# Patient Record
Sex: Male | Born: 1965 | Race: White | Hispanic: No | Smoking: Current every day smoker
Health system: Southern US, Community
[De-identification: ages and names within clinical notes are randomized; demographics above are authoritative.]

## PROBLEM LIST (undated history)

## (undated) DIAGNOSIS — I2510 Atherosclerotic heart disease of native coronary artery without angina pectoris: Secondary | ICD-10-CM

## (undated) DIAGNOSIS — R569 Unspecified convulsions: Secondary | ICD-10-CM

## (undated) DIAGNOSIS — F32A Depression, unspecified: Secondary | ICD-10-CM

## (undated) DIAGNOSIS — D649 Anemia, unspecified: Secondary | ICD-10-CM

## (undated) DIAGNOSIS — F329 Major depressive disorder, single episode, unspecified: Secondary | ICD-10-CM

## (undated) DIAGNOSIS — Z993 Dependence on wheelchair: Secondary | ICD-10-CM

## (undated) DIAGNOSIS — K219 Gastro-esophageal reflux disease without esophagitis: Secondary | ICD-10-CM

## (undated) DIAGNOSIS — I739 Peripheral vascular disease, unspecified: Secondary | ICD-10-CM

## (undated) DIAGNOSIS — I10 Essential (primary) hypertension: Secondary | ICD-10-CM

## (undated) DIAGNOSIS — J449 Chronic obstructive pulmonary disease, unspecified: Secondary | ICD-10-CM

## (undated) DIAGNOSIS — I509 Heart failure, unspecified: Secondary | ICD-10-CM

## (undated) HISTORY — PX: VASCULAR SURGERY: SHX849

## (undated) HISTORY — PX: OTHER SURGICAL HISTORY: SHX169

---

## 2005-06-14 ENCOUNTER — Emergency Department (HOSPITAL_COMMUNITY): Admission: EM | Admit: 2005-06-14 | Discharge: 2005-06-14 | Payer: Self-pay | Admitting: Emergency Medicine

## 2005-06-21 ENCOUNTER — Ambulatory Visit: Admission: RE | Admit: 2005-06-21 | Discharge: 2005-06-21 | Payer: Self-pay | Admitting: Podiatry

## 2005-06-21 ENCOUNTER — Encounter: Payer: Self-pay | Admitting: Vascular Surgery

## 2005-06-22 ENCOUNTER — Emergency Department (HOSPITAL_COMMUNITY): Admission: EM | Admit: 2005-06-22 | Discharge: 2005-06-22 | Payer: Self-pay | Admitting: Emergency Medicine

## 2005-06-24 ENCOUNTER — Ambulatory Visit (HOSPITAL_COMMUNITY): Admission: RE | Admit: 2005-06-24 | Discharge: 2005-06-24 | Payer: Self-pay | Admitting: Vascular Surgery

## 2005-06-29 ENCOUNTER — Inpatient Hospital Stay (HOSPITAL_COMMUNITY): Admission: RE | Admit: 2005-06-29 | Discharge: 2005-07-02 | Payer: Self-pay | Admitting: Vascular Surgery

## 2005-06-29 ENCOUNTER — Encounter (INDEPENDENT_AMBULATORY_CARE_PROVIDER_SITE_OTHER): Payer: Self-pay | Admitting: Specialist

## 2005-06-30 ENCOUNTER — Encounter: Payer: Self-pay | Admitting: Vascular Surgery

## 2005-07-29 ENCOUNTER — Ambulatory Visit: Payer: Self-pay | Admitting: Pain Medicine

## 2005-08-02 ENCOUNTER — Ambulatory Visit: Payer: Self-pay | Admitting: Pain Medicine

## 2005-08-09 ENCOUNTER — Ambulatory Visit: Payer: Self-pay | Admitting: Pain Medicine

## 2005-08-10 ENCOUNTER — Inpatient Hospital Stay (HOSPITAL_COMMUNITY): Admission: EM | Admit: 2005-08-10 | Discharge: 2005-08-16 | Payer: Self-pay | Admitting: Emergency Medicine

## 2005-08-13 ENCOUNTER — Ambulatory Visit: Payer: Self-pay | Admitting: Physical Medicine & Rehabilitation

## 2005-08-13 ENCOUNTER — Encounter: Payer: Self-pay | Admitting: Vascular Surgery

## 2006-04-06 ENCOUNTER — Ambulatory Visit: Payer: Self-pay | Admitting: Vascular Surgery

## 2006-04-07 ENCOUNTER — Emergency Department (HOSPITAL_COMMUNITY): Admission: EM | Admit: 2006-04-07 | Discharge: 2006-04-07 | Payer: Self-pay | Admitting: Emergency Medicine

## 2006-04-11 ENCOUNTER — Ambulatory Visit: Payer: Self-pay | Admitting: Vascular Surgery

## 2006-04-11 ENCOUNTER — Ambulatory Visit (HOSPITAL_COMMUNITY): Admission: RE | Admit: 2006-04-11 | Discharge: 2006-04-11 | Payer: Self-pay | Admitting: Vascular Surgery

## 2006-04-14 ENCOUNTER — Inpatient Hospital Stay (HOSPITAL_COMMUNITY): Admission: RE | Admit: 2006-04-14 | Discharge: 2006-04-16 | Payer: Self-pay | Admitting: Vascular Surgery

## 2006-05-03 ENCOUNTER — Emergency Department (HOSPITAL_COMMUNITY): Admission: EM | Admit: 2006-05-03 | Discharge: 2006-05-04 | Payer: Self-pay | Admitting: Emergency Medicine

## 2006-05-08 ENCOUNTER — Emergency Department: Payer: Self-pay

## 2006-05-08 ENCOUNTER — Emergency Department (HOSPITAL_COMMUNITY): Admission: EM | Admit: 2006-05-08 | Discharge: 2006-05-08 | Payer: Self-pay | Admitting: Emergency Medicine

## 2006-05-11 ENCOUNTER — Ambulatory Visit: Payer: Self-pay | Admitting: Vascular Surgery

## 2006-05-17 ENCOUNTER — Inpatient Hospital Stay (HOSPITAL_COMMUNITY): Admission: RE | Admit: 2006-05-17 | Discharge: 2006-05-21 | Payer: Self-pay | Admitting: Vascular Surgery

## 2006-05-17 ENCOUNTER — Encounter (INDEPENDENT_AMBULATORY_CARE_PROVIDER_SITE_OTHER): Payer: Self-pay | Admitting: Specialist

## 2006-05-17 ENCOUNTER — Ambulatory Visit: Payer: Self-pay | Admitting: Vascular Surgery

## 2006-05-18 ENCOUNTER — Ambulatory Visit: Payer: Self-pay | Admitting: Physical Medicine & Rehabilitation

## 2006-06-22 ENCOUNTER — Ambulatory Visit: Payer: Self-pay | Admitting: Vascular Surgery

## 2006-07-01 ENCOUNTER — Ambulatory Visit: Payer: Self-pay | Admitting: Vascular Surgery

## 2006-11-20 ENCOUNTER — Emergency Department (HOSPITAL_COMMUNITY): Admission: EM | Admit: 2006-11-20 | Discharge: 2006-11-20 | Payer: Self-pay | Admitting: Emergency Medicine

## 2007-01-16 ENCOUNTER — Ambulatory Visit: Payer: Self-pay | Admitting: Oncology

## 2007-03-16 ENCOUNTER — Ambulatory Visit: Payer: Self-pay | Admitting: Oncology

## 2007-11-05 ENCOUNTER — Emergency Department: Payer: Self-pay | Admitting: Emergency Medicine

## 2007-12-28 ENCOUNTER — Emergency Department: Payer: Self-pay | Admitting: Emergency Medicine

## 2008-02-16 ENCOUNTER — Emergency Department: Payer: Self-pay | Admitting: Emergency Medicine

## 2008-03-10 ENCOUNTER — Emergency Department: Payer: Self-pay | Admitting: Emergency Medicine

## 2008-03-12 ENCOUNTER — Emergency Department: Payer: Self-pay | Admitting: Emergency Medicine

## 2008-04-08 ENCOUNTER — Emergency Department: Payer: Self-pay | Admitting: Emergency Medicine

## 2009-05-29 ENCOUNTER — Ambulatory Visit: Payer: Self-pay | Admitting: Vascular Surgery

## 2009-05-31 ENCOUNTER — Emergency Department: Payer: Self-pay

## 2009-06-13 ENCOUNTER — Emergency Department: Payer: Self-pay

## 2009-06-16 ENCOUNTER — Emergency Department (HOSPITAL_COMMUNITY): Admission: EM | Admit: 2009-06-16 | Discharge: 2009-06-16 | Payer: Self-pay | Admitting: Emergency Medicine

## 2009-06-19 ENCOUNTER — Emergency Department: Payer: Self-pay | Admitting: Emergency Medicine

## 2010-06-23 NOTE — Assessment & Plan Note (Signed)
OFFICE VISIT   [name], [name]  DOB:  Jul 31, 1965                                       05/29/2009  CHART#:[id number]   I saw patient in the office today concerning his bilateral below-the-  knee amputations.  He had a right below-the-knee amputation in 2007 and  subsequently had a left below-the-knee amputation in April of 2008.  He  came in today with a chief complaint of some phantom pain in the right  lower extremity and also a small wound on his right below-the-knee  amputation stump.  He does not remember any specific injury to this  area.  He has had no real pain in his stumps otherwise.   SOCIAL HISTORY:  He does continue to smoke.   On review of systems, he has had no recent fever or chills.  He had no  chest pain, chest pressure, palpitations or arrhythmias.   On physical examination, this is a pleasant 45 year old gentleman who  appears his stated age.  Blood pressure is 122/75. Heart rate is 71.  Lungs are clear bilaterally to auscultation.  On cardiovascular exam, he  has a regular rate and rhythm.  He has palpable femoral pulses.  I  cannot palpate popliteal pulses.  Extremity exam reveals a small, dry,  superficial ulcer on his distal right below-the-knee amputation site  with no surrounding cellulitis or drainage.  Of note, he is quite  malnourished and has muscle wasting bilaterally.  He states that he has  had a poor appetite.  Both stumps appear adequately perfused and have a  good temperature.   With respect to his phantom pain, we did review his medications and  noted the CBC in [facility], and apparently he has been on methadone,  although he tells me he has not been taking it.  In addition, they have  him on Lyrica 50 mg t.i.d., although he tells me he did not have this  filled because it was too expensive.  I have explained that with respect  to his phantom pain, I think the Lyrica would be best.  He is trying to  get his  Medicaid card so that he can get this prescription filled.  They  have had him on 50 t.i.d.  If this were not successful, he could  potentially take a higher dose up to 100 mg t.i.d.  For the time being,  I have written him a prescription for 30 Ultram for pain.  We have  discussed the importance of keeping the skin well lubricated in hopes of  this ulcer at the distal of the right BKA heals.  I have also expressed  my concerns about his nutritional status.  I have instructed him to  please call if the wound does not heal on the right or his symptoms  progress.     Di Kindle. Edilia Bo, M.D.  Electronically Signed   CSD/MEDQ  D:  05/29/2009  T:  06/02/2009  Job:  [id number]

## 2010-06-26 NOTE — Discharge Summary (Signed)
[name], [name]             ACCOUNT NO.:  [id number]   MEDICAL RECORD NO.:  [id number]          PATIENT TYPE:  INP   LOCATION:  5028                         FACILITY:  [facility]   PHYSICIAN:  Di Kindle. Edilia Bo, M.D.DATE OF BIRTH:  1965/12/20   DATE OF ADMISSION:  05/17/2006  DATE OF DISCHARGE:                               DISCHARGE SUMMARY   DATE OF ANTICIPATED DISCHARGE:  May 21, 2006.   PRIMARY ADMITTING DIAGNOSES:  1. Rest pain, left foot.  2. Ischemic left lower extremity.   ADDITIONAL DISCHARGE DIAGNOSES:  1. Rest pain, left foot.  2. Ischemic left lower extremity.  3. History of seizure disorder.  4. History of pain control.  5. History of substance abuse on methadone.  6. Ongoing tobacco abuse.   PROCEDURES PERFORMED:  Left below-knee amputation.   HISTORY:  The patient is a 45 year old male who is known to the vascular  surgery service for a previous right below-knee amputation.  He recently  presented to Dr. Adele Dan office with progressive rest pain and dry  gangrene of the left great toe.  He underwent an arteriogram which  showed that his only patent distal vessel was a tarsal branch in the  left foot.  As a last ditch effort to try to salvage his left foot, he  underwent a left below-knee popliteal to tarsal artery bypass.  Since  that time, he has continued to have persistent rest pain in the left  foot and recently was seen back in the office by Dr. Edilia Bo.  It was  felt that this time that because of his pain control issues and lack of  further possibilities for revascularization, that he should undergo a  below-knee amputation at this time.  Dr. Edilia Bo explained the risks,  benefits, and alternatives of the procedure to the patient and he agreed  to proceed with surgery.   HOSPITAL COURSE:  He was admitted to [facility] on May 17, 2006, and underwent a left below knee amputation.  He tolerated the  procedure well and was  transferred to the floor in stable condition.  Postoperatively, he has done fairly well.  He initially required PCA  Dilaudid, in addition to ongoing methadone for pain control.  However,  at present the PCA has been discontinued and he is having fair pain  control with oral pain medications.  From a surgical standpoint, he has  remained stable.  His incisions are all healing well.  He has been seen  by physical therapy and has met all of his acute PT goals.  He has  remained afebrile and all vital signs have been stable.  His most recent  labs show a hemoglobin of 9.3, hematocrit 26.7, white count 11.6,  platelets 472.  Sodium 137, potassium 4, BUN 7, creatinine 0.74.  He has  been switched from IV to p.o. antibiotics and it is felt that if he  remained stable over the next 24 hours, he will be ready for discharged  home on May 21, 2006.   DISCHARGE MEDICATIONS:  1. Cipro 500 mg b.i.d. with duration of antibiotics to be  determined      by Dr. Edilia Bo upon followup in the office.  2. Methadone 40 mg q.6 h.  3. Dilantin 100 mg, 3 tablets daily.   DISCHARGE INSTRUCTIONS:  1. He is asked to refrain from any strenuous activity.  2. Home health PT will be arranged.  3. All durable medical equipment has been obtained by the clinical      social worker.  4. He will continue his same preoperative diet.  5. He may clean his incision site daily with soap and water and      continue daily dressing changes.   DISCHARGE FOLLOWUP:  He will see Dr. Edilia Bo back in the office in 3-4  weeks, at which time staples will be removed and his wound will be  rechecked.  If he experiences any problems or has questions in the  interim, he is asked to contact our office immediately.      Coral Ceo, P.A.      Di Kindle. Edilia Bo, M.D.  Electronically Signed    GC/MEDQ  D:  05/20/2006  T:  05/20/2006  Job:  [id number]

## 2010-06-26 NOTE — Op Note (Signed)
[name], [name]             ACCOUNT NO.:  [id number]   MEDICAL RECORD NO.:  [id number]          PATIENT TYPE:  INP   LOCATION:  2550                         FACILITY:  [facility]   PHYSICIAN:  Di Kindle. Edilia Bo, M.D.DATE OF BIRTH:  1965/03/27   DATE OF PROCEDURE:  05/17/2006  DATE OF DISCHARGE:                               OPERATIVE REPORT   PREOPERATIVE DIAGNOSIS:  Ischemic left lower extremity.   POSTOPERATIVE DIAGNOSIS:  Ischemic left lower extremity.   PROCEDURE:  Left below-the-knee amputation.   SURGEON:  Di Kindle. Edilia Bo, M.D.   ASSISTANT:  Jerold Coombe, P.A.   ANESTHESIA:  General.   TECHNIQUE:  The patient was taken to the operating room and received a  general anesthetic.  The left lower extremity was prepped and draped in  usual sterile fashion.  The circumference of limb was measured 8 cm  distal to the tibial tuberosity, and two-thirds of this distance was  used to mark the anterior skin flap.  A long posterior flap of equal  length was marked.  The tourniquet was placed on the thigh.  The leg was  exsanguinated with an Esmarch bandage and the tourniquet inflated to 250  mmHg.   Under tourniquet control, the incision was carried down to the skin,  subcutaneous tissue, muscle, and fascia of the tibia and fibula which  were dissected free.  The periosteum was elevated and the bone divided  proximal to the level of skin division.  The anterior aspect of the  tibia was beveled.  The arteries and veins were individually suture  ligated with 2-0 silk ties.  The tourniquet was then released.  I did  have to debulk the muscle to allow me to close the wound without undue  tension.  Hemostasis was obtained in the wound.  The edges of the bone  were rasped.  The wound was then irrigated with copious amounts of  saline.  The fascial layer was then closed with interrupted 2-0 Vicryl  sutures.  The skin was closed with staples.  A sterile dressing was  applied.   The patient tolerated the procedure well and was transferred to the  recovery room in satisfactory condition.  All needle and sponge counts  were correct.      Di Kindle. Edilia Bo, M.D.  Electronically Signed     CSD/MEDQ  D:  05/17/2006  T:  05/17/2006  Job:  [id number]

## 2010-06-26 NOTE — H&P (Signed)
[name], [name]              ACCOUNT NO.:  [id number]   MEDICAL RECORD NO.:  [id number]           PATIENT TYPE:   LOCATION:                                 FACILITY:   PHYSICIAN:  Di Kindle. Edilia Bo, M.D.DATE OF BIRTH:   DATE OF ADMISSION:  05/17/2006  DATE OF DISCHARGE:                              HISTORY & PHYSICAL   REASON FOR ADMISSION:  Rest pain of the left foot.   HISTORY OF PRESENT ILLNESS:  This is a pleasant 45 year old gentleman  who has undergone a previous right below-the-knee amputation.  He had  presented with progressive rest pain and dry gangrene of the left great  toe.  He underwent an arteriogram which showed the only distal vessel  was a tarsal branch in the foot.  As a last ditch effort to try to  salvage his left foot, he underwent a left below-knee popliteal to  tarsal artery bypass.  He has continued have persistent rest pain in the  left foot despite this and for this reason presents for a below-the-knee  amputation on the left side.   PAST MEDICAL HISTORY:  His past medical history is fairly unremarkable.  He does have a history of seizure disorder and is on Dilantin.  Also had  significant problems with pain control and history of drug addiction in  the past.  He denies any history of diabetes, hypertension,  hypercholesterolemia, history of previous myocardial infarction, or  history of congestive heart failure.   FAMILY HISTORY:  He is adopted.  He does not know his family history.   SOCIAL HISTORY:  Is married.  Does not have any children.  He smokes one  to two packs a day of cigarettes and has been smoking for 15 years.   MEDICATIONS ON ADMISSION:  1. Dilantin 300 mg p.o. daily.  2. Methadone 40 mg p.o. q.6 h for pain.   ALLERGIES:  No known drug allergies.   REVIEW OF SYSTEMS:  GENERAL:  Generally he has had no weight loss,  weight gain or problems with appetite.  CARDIAC:  He has had no chest  pain, chest pressure, palpitations,  or arrhythmias.  PULMONARY:  He has  had no bronchitis, asthma or wheezing.  GI:  He had no recent change in  bowel habits and has no history of peptic ulcer disease.  GU: He has had  no dysuria or frequency.  VASCULAR:  He had a right below-knee  amputation and now has rest pain in the left foot.  NEUROLOGIC:  He had  no dizziness, blackouts or headaches.   PHYSICAL EXAMINATION:  VITAL SIGNS: Blood pressure is 118/64.  NECK:  I do not detect any carotid bruits.  LUNGS:  Clear bilaterally to auscultation.  CARDIAC:  He has a regular rate and rhythm.  ABDOMEN:  His abdomen is soft and nontender.  EXTREMITIES:  He has a well-healed, right below-the-knee amputation.  On  the left side, he has a posterior tibial signal with a Doppler which may  represent his graft.  He has significant swelling in the left leg with  some mild erythema.  He is unable to elevate his  leg at all because of  the rest pain in his left foot.  The toe has shown no improvement.   IMPRESSION:  This patient presents with progressive ischemia of the left  foot despite attempted bypass in the left.  I think the only option is  primary amputation.  Given his young age, I think it is worth attempting  a below-the-knee amputation, although with all the problems he is having  with swelling, there is going to be some risk of nonhealing.  That risk  is probably 10-15%.  However, he is agreeable to proceed.  All his  questions were answered.  We have discussed the indications for the  procedure and potential complications.      Di Kindle. Edilia Bo, M.D.  Electronically Signed     CSD/MEDQ  D:  05/11/2006  T:  05/11/2006  Job:  [id number]

## 2010-11-29 ENCOUNTER — Emergency Department (HOSPITAL_COMMUNITY)
Admission: EM | Admit: 2010-11-29 | Discharge: 2010-11-30 | Disposition: A | Discharge status: Home / Self Care | Payer: Medicare Other | Attending: Emergency Medicine | Admitting: Emergency Medicine

## 2010-11-29 DIAGNOSIS — R252 Cramp and spasm: Secondary | ICD-10-CM | POA: Insufficient documentation

## 2010-11-29 DIAGNOSIS — M2550 Pain in unspecified joint: Secondary | ICD-10-CM | POA: Insufficient documentation

## 2010-11-29 DIAGNOSIS — S88119A Complete traumatic amputation at level between knee and ankle, unspecified lower leg, initial encounter: Secondary | ICD-10-CM | POA: Insufficient documentation

## 2010-11-29 DIAGNOSIS — M79609 Pain in unspecified limb: Secondary | ICD-10-CM | POA: Insufficient documentation

## 2010-11-29 DIAGNOSIS — Z79899 Other long term (current) drug therapy: Secondary | ICD-10-CM | POA: Insufficient documentation

## 2010-11-29 DIAGNOSIS — IMO0001 Reserved for inherently not codable concepts without codable children: Secondary | ICD-10-CM | POA: Insufficient documentation

## 2010-11-29 DIAGNOSIS — G40909 Epilepsy, unspecified, not intractable, without status epilepticus: Secondary | ICD-10-CM | POA: Insufficient documentation

## 2010-11-29 DIAGNOSIS — F17200 Nicotine dependence, unspecified, uncomplicated: Secondary | ICD-10-CM | POA: Insufficient documentation

## 2010-12-12 ENCOUNTER — Emergency Department (HOSPITAL_COMMUNITY)
Admission: EM | Admit: 2010-12-12 | Discharge: 2010-12-12 | Disposition: A | Discharge status: Home / Self Care | Payer: Medicare Other | Attending: Emergency Medicine | Admitting: Emergency Medicine

## 2010-12-12 DIAGNOSIS — G40909 Epilepsy, unspecified, not intractable, without status epilepticus: Secondary | ICD-10-CM | POA: Insufficient documentation

## 2010-12-12 DIAGNOSIS — M79609 Pain in unspecified limb: Secondary | ICD-10-CM | POA: Insufficient documentation

## 2010-12-12 DIAGNOSIS — G8929 Other chronic pain: Secondary | ICD-10-CM | POA: Insufficient documentation

## 2010-12-12 DIAGNOSIS — I739 Peripheral vascular disease, unspecified: Secondary | ICD-10-CM | POA: Insufficient documentation

## 2011-02-07 ENCOUNTER — Emergency Department: Payer: Self-pay | Admitting: Emergency Medicine

## 2012-12-28 ENCOUNTER — Encounter (HOSPITAL_COMMUNITY): Payer: Self-pay | Admitting: *Deleted

## 2012-12-28 ENCOUNTER — Observation Stay (HOSPITAL_COMMUNITY)
Admission: AD | Admit: 2012-12-28 | Discharge: 2012-12-31 | Disposition: A | Discharge status: Skilled Nursing Facility | Payer: Medicare Other | Source: Other Acute Inpatient Hospital | Attending: Internal Medicine | Admitting: Internal Medicine

## 2012-12-28 DIAGNOSIS — G894 Chronic pain syndrome: Secondary | ICD-10-CM

## 2012-12-28 DIAGNOSIS — F17200 Nicotine dependence, unspecified, uncomplicated: Secondary | ICD-10-CM | POA: Insufficient documentation

## 2012-12-28 DIAGNOSIS — J449 Chronic obstructive pulmonary disease, unspecified: Secondary | ICD-10-CM | POA: Insufficient documentation

## 2012-12-28 DIAGNOSIS — I82409 Acute embolism and thrombosis of unspecified deep veins of unspecified lower extremity: Secondary | ICD-10-CM | POA: Insufficient documentation

## 2012-12-28 DIAGNOSIS — I701 Atherosclerosis of renal artery: Secondary | ICD-10-CM | POA: Insufficient documentation

## 2012-12-28 DIAGNOSIS — I2510 Atherosclerotic heart disease of native coronary artery without angina pectoris: Secondary | ICD-10-CM | POA: Insufficient documentation

## 2012-12-28 DIAGNOSIS — I2699 Other pulmonary embolism without acute cor pulmonale: Principal | ICD-10-CM | POA: Insufficient documentation

## 2012-12-28 DIAGNOSIS — N189 Chronic kidney disease, unspecified: Secondary | ICD-10-CM | POA: Insufficient documentation

## 2012-12-28 DIAGNOSIS — I999 Unspecified disorder of circulatory system: Secondary | ICD-10-CM

## 2012-12-28 DIAGNOSIS — S88119A Complete traumatic amputation at level between knee and ankle, unspecified lower leg, initial encounter: Secondary | ICD-10-CM | POA: Insufficient documentation

## 2012-12-28 DIAGNOSIS — I739 Peripheral vascular disease, unspecified: Secondary | ICD-10-CM

## 2012-12-28 DIAGNOSIS — I428 Other cardiomyopathies: Secondary | ICD-10-CM | POA: Insufficient documentation

## 2012-12-28 DIAGNOSIS — F1120 Opioid dependence, uncomplicated: Secondary | ICD-10-CM

## 2012-12-28 DIAGNOSIS — Z89511 Acquired absence of right leg below knee: Secondary | ICD-10-CM

## 2012-12-28 DIAGNOSIS — I129 Hypertensive chronic kidney disease with stage 1 through stage 4 chronic kidney disease, or unspecified chronic kidney disease: Secondary | ICD-10-CM | POA: Insufficient documentation

## 2012-12-28 DIAGNOSIS — F3289 Other specified depressive episodes: Secondary | ICD-10-CM | POA: Insufficient documentation

## 2012-12-28 DIAGNOSIS — F329 Major depressive disorder, single episode, unspecified: Secondary | ICD-10-CM | POA: Insufficient documentation

## 2012-12-28 DIAGNOSIS — R569 Unspecified convulsions: Secondary | ICD-10-CM | POA: Insufficient documentation

## 2012-12-28 DIAGNOSIS — I509 Heart failure, unspecified: Secondary | ICD-10-CM

## 2012-12-28 DIAGNOSIS — J4489 Other specified chronic obstructive pulmonary disease: Secondary | ICD-10-CM

## 2012-12-28 HISTORY — DX: Atherosclerotic heart disease of native coronary artery without angina pectoris: I25.10

## 2012-12-28 HISTORY — DX: Unspecified convulsions: R56.9

## 2012-12-28 HISTORY — DX: Major depressive disorder, single episode, unspecified: F32.9

## 2012-12-28 HISTORY — DX: Heart failure, unspecified: I50.9

## 2012-12-28 HISTORY — DX: Chronic obstructive pulmonary disease, unspecified: J44.9

## 2012-12-28 HISTORY — DX: Depression, unspecified: F32.A

## 2012-12-28 HISTORY — DX: Anemia, unspecified: D64.9

## 2012-12-28 HISTORY — DX: Peripheral vascular disease, unspecified: I73.9

## 2012-12-28 HISTORY — DX: Essential (primary) hypertension: I10

## 2012-12-28 MED ORDER — LISINOPRIL 2.5 MG PO TABS
2.5000 mg | ORAL_TABLET | Freq: Every day | ORAL | Status: DC
  Administered 2012-12-28 – 2012-12-29 (×2): 2.5 mg via ORAL
  Filled 2012-12-28 (×2): qty 1

## 2012-12-28 MED ORDER — DIAZEPAM 5 MG PO TABS
5.0000 mg | ORAL_TABLET | Freq: Three times a day (TID) | ORAL | Status: DC | PRN
  Administered 2012-12-28 – 2012-12-31 (×8): 5 mg via ORAL
  Filled 2012-12-28 (×8): qty 1

## 2012-12-28 MED ORDER — FERROUS SULFATE 325 (65 FE) MG PO TABS
325.0000 mg | ORAL_TABLET | Freq: Two times a day (BID) | ORAL | Status: DC
  Administered 2012-12-28 – 2012-12-30 (×5): 325 mg via ORAL
  Filled 2012-12-28 (×10): qty 1

## 2012-12-28 MED ORDER — FENTANYL 50 MCG/HR TD PT72
100.0000 ug | MEDICATED_PATCH | TRANSDERMAL | Status: DC
  Administered 2012-12-28: 100 ug via TRANSDERMAL
  Filled 2012-12-28: qty 2

## 2012-12-28 MED ORDER — CARVEDILOL 25 MG PO TABS
25.0000 mg | ORAL_TABLET | Freq: Two times a day (BID) | ORAL | Status: DC
  Administered 2012-12-28 – 2012-12-31 (×6): 25 mg via ORAL
  Filled 2012-12-28 (×12): qty 1

## 2012-12-28 MED ORDER — OXYCODONE HCL 5 MG PO TABS
5.0000 mg | ORAL_TABLET | ORAL | Status: DC | PRN
  Administered 2012-12-28 – 2012-12-29 (×3): 5 mg via ORAL
  Filled 2012-12-28 (×3): qty 1

## 2012-12-28 MED ORDER — VITAMIN D3 25 MCG (1000 UNIT) PO TABS
2000.0000 [IU] | ORAL_TABLET | Freq: Every day | ORAL | Status: DC
  Administered 2012-12-29 – 2012-12-30 (×2): 2000 [IU] via ORAL
  Filled 2012-12-28 (×3): qty 2

## 2012-12-28 MED ORDER — ONDANSETRON HCL 4 MG PO TABS
4.0000 mg | ORAL_TABLET | Freq: Four times a day (QID) | ORAL | Status: DC | PRN
  Administered 2012-12-31: 4 mg via ORAL
  Filled 2012-12-28: qty 1

## 2012-12-28 MED ORDER — MEGESTROL ACETATE 400 MG/10ML PO SUSP
400.0000 mg | Freq: Every day | ORAL | Status: DC
  Administered 2012-12-28 – 2012-12-30 (×3): 400 mg via ORAL
  Filled 2012-12-28 (×4): qty 10

## 2012-12-28 MED ORDER — PANTOPRAZOLE SODIUM 40 MG PO TBEC
40.0000 mg | DELAYED_RELEASE_TABLET | Freq: Every day | ORAL | Status: DC
  Administered 2012-12-28 – 2012-12-30 (×3): 40 mg via ORAL
  Filled 2012-12-28 (×3): qty 1

## 2012-12-28 MED ORDER — MORPHINE SULFATE 2 MG/ML IJ SOLN
2.0000 mg | Freq: Once | INTRAMUSCULAR | Status: AC
  Administered 2012-12-28: 2 mg via INTRAVENOUS
  Filled 2012-12-28: qty 1

## 2012-12-28 MED ORDER — VITAMIN D-3 25 MCG (1000 UT) PO CAPS: 2000.0000 [IU] | ORAL_CAPSULE | Freq: Every day | ORAL | Status: DC

## 2012-12-28 MED ORDER — SENNA 8.6 MG PO TABS
1.0000 | ORAL_TABLET | Freq: Two times a day (BID) | ORAL | Status: DC
  Administered 2012-12-28 – 2012-12-30 (×3): 8.6 mg via ORAL
  Filled 2012-12-28 (×10): qty 1

## 2012-12-28 MED ORDER — ONDANSETRON HCL 4 MG/2ML IJ SOLN
4.0000 mg | Freq: Four times a day (QID) | INTRAMUSCULAR | Status: DC | PRN
  Filled 2012-12-28: qty 2

## 2012-12-28 MED ORDER — METHADONE HCL 10 MG PO TABS
20.0000 mg | ORAL_TABLET | Freq: Three times a day (TID) | ORAL | Status: DC
  Administered 2012-12-28 – 2012-12-29 (×3): 20 mg via ORAL
  Filled 2012-12-28 (×3): qty 2

## 2012-12-28 MED ORDER — ACETAMINOPHEN 325 MG PO TABS
650.0000 mg | ORAL_TABLET | Freq: Four times a day (QID) | ORAL | Status: DC | PRN
Start: 2012-12-28 — End: 2012-12-31
  Administered 2012-12-29 – 2012-12-30 (×3): 650 mg via ORAL
  Filled 2012-12-28 (×4): qty 2

## 2012-12-28 MED ORDER — ATORVASTATIN CALCIUM 20 MG PO TABS
20.0000 mg | ORAL_TABLET | Freq: Every day | ORAL | Status: DC
  Administered 2012-12-28 – 2012-12-30 (×3): 20 mg via ORAL
  Filled 2012-12-28 (×4): qty 1

## 2012-12-28 MED ORDER — DOCUSATE SODIUM 100 MG PO CAPS
100.0000 mg | ORAL_CAPSULE | Freq: Two times a day (BID) | ORAL | Status: DC
  Administered 2012-12-28 – 2012-12-30 (×3): 100 mg via ORAL
  Filled 2012-12-28 (×5): qty 1

## 2012-12-28 MED ORDER — PHENYTOIN SODIUM EXTENDED 100 MG PO CAPS
200.0000 mg | ORAL_CAPSULE | Freq: Three times a day (TID) | ORAL | Status: DC
  Administered 2012-12-28 – 2012-12-31 (×9): 200 mg via ORAL
  Filled 2012-12-28 (×17): qty 2

## 2012-12-28 NOTE — Progress Notes (Signed)
NURSING PROGRESS NOTE  [name] [id number] Admission Data: 12/28/2012 8:26 PM Attending Provider: Maretta Bees, MD WUJ:[id number], Chyrl Civatte, MD Code Status: full  [name] is a 47 y.o. male patient admitted from ED:  -No acute distress noted.  -No complaints of shortness of breath.  -No complaints of chest pain.   Cardiac Monitoring: N/A   Blood pressure 139/87, pulse 92, temperature 99.3 F (37.4 C), temperature source Oral, resp. rate 16, height 5\' 10"  (1.778 m), weight 50.803 kg (112 lb), SpO2 100.00%.   IV Fluids:  IV in place, occlusive dsg intact without redness, IV cath hand left, condition patent and no redness and wrist left, condition patent and no redness   Allergies:  Review of patient's allergies indicates no known allergies.  Past Medical History:   has a past medical history of COPD (chronic obstructive pulmonary disease); Peripheral vascular disease; CHF (congestive heart failure); Seizures; Anemia; Coronary artery disease; Hypertension; and Depression.  Past Surgical History:   has past surgical history that includes Vascular surgery.  Social History:   reports that he has been smoking Cigarettes.  He has a 15 pack-year smoking history. He uses smokeless tobacco. He reports that he uses illicit drugs (Marijuana). He reports that he does not drink alcohol.  Skin: Intact  Patient/Family orientated to room. Information packet given to patient/family. Admission inpatient armband information verified with patient/family to include name and date of birth and placed on patient arm. Side rails up x 2, fall assessment and education completed with patient/family. Patient/family able to verbalize understanding of risk associated with falls and verbalized understanding to call for assistance before getting out of bed. Call light within reach. Patient/family able to voice and demonstrate understanding of unit orientation instructions. Admitting Md was notified  the patient has arrived to the unit.   Will continue to evaluate and treat per MD orders.

## 2012-12-28 NOTE — Progress Notes (Signed)
Patient has his wallet and $140 in cash. He wanted to keep it in the room with him and didn't want to take it to the security because he had a negative experience with sending his belongings to security his last hospitalization. He forgot his belongings and didn't get them immediately

## 2012-12-28 NOTE — H&P (Signed)
[facility] History and Physical  CEJAY CAMBRE HQI:[id number] DOB: April 22, 1965 DOA: 12/28/2012  Referring physician:  PCP: Evelene Croon, MD  Specialists:   Chief Complaint: s/p BKA L   HPI: [name] is a 47 y.o. male with PMH of cardiomyopathy, CHF, COPD, CKD, chronic pain syndrome, opioid dependance, h/o GIB, PAD s/p BL BKA (not on antiplatelets due to GIB) had worsening of L leg pain above knee; physician at [facility] ordered doppler which showed no blood flow at L stump then he has referred for admission and vascular surgery evaluation;   He denies any fever, no chest pain, no SOB, no nausea, vomiting or diarrhea  Review of Systems: The patient denies anorexia, fever, weight loss,, vision loss, decreased hearing, hoarseness, chest pain, syncope, dyspnea on exertion, peripheral edema, balance deficits, hemoptysis, abdominal pain, melena, hematochezia, severe indigestion/heartburn, hematuria, incontinence, genital sores, muscle weakness, suspicious skin lesions, transient blindness,  unusual weight change, abnormal bleeding, enlarged lymph nodes, angioedema, and breast masses.    No past medical history on file. No past surgical history on file. Social History:  has no tobacco, alcohol, and drug history on file. AL:  where does patient live--home, ALF, SNF? and with whom if at home? No:  Can patient participate in ADLs?  No Known Allergies  No family history on file.  Denies CVA (be sure to complete)  Prior to Admission medications   Medication Sig Start Date End Date Taking? Authorizing Provider  acetaminophen (TYLENOL) 325 MG tablet Take 650 mg by mouth every 6 (six) hours as needed for moderate pain.   Yes Historical Provider, MD  atorvastatin (LIPITOR) 20 MG tablet Take 20 mg by mouth daily.   Yes Historical Provider, MD  carvedilol (COREG) 25 MG tablet Take 25 mg by mouth 2 (two) times daily with a meal.   Yes Historical Provider, MD  Cholecalciferol (VITAMIN  D-3) 1000 UNITS CAPS Take 2,000 Units by mouth daily.   Yes Historical Provider, MD  diazepam (VALIUM) 10 MG tablet Take 10 mg by mouth 3 (three) times daily.   Yes Historical Provider, MD  diazepam (VALIUM) 5 MG tablet Take 2.5 mg by mouth 3 (three) times daily as needed for muscle spasms.   Yes Historical Provider, MD  esomeprazole (NEXIUM) 40 MG capsule Take 40 mg by mouth daily at 12 noon.   Yes Historical Provider, MD  fentaNYL (DURAGESIC - DOSED MCG/HR) 100 MCG/HR Place 100 mcg onto the skin every 3 (three) days.   Yes Historical Provider, MD  ferrous sulfate 325 (65 FE) MG tablet Take 325 mg by mouth 2 (two) times daily.   Yes Historical Provider, MD  gabapentin (NEURONTIN) 400 MG capsule Take 400 mg by mouth 3 (three) times daily.   Yes Historical Provider, MD  Homeopathic Products (ANTACID MEDICINE PO) Take 20 mLs by mouth every 4 (four) hours as needed (indigestion).   Yes Historical Provider, MD  lisinopril (PRINIVIL,ZESTRIL) 2.5 MG tablet Take 2.5 mg by mouth daily.   Yes Historical Provider, MD  Magnesium Hydroxide (MILK OF MAGNESIA PO) Take 30 mLs by mouth daily as needed (constipation).   Yes Historical Provider, MD  megestrol (MEGACE) 400 MG/10ML suspension Take 400 mg by mouth daily.   Yes Historical Provider, MD  methadone (DOLOPHINE) 10 MG tablet Take 20 mg by mouth 3 (three) times daily.   Yes Historical Provider, MD  phenytoin (DILANTIN) 100 MG ER capsule Take 200 mg by mouth 3 (three) times daily.   Yes Historical Provider, MD  Physical Exam: Filed Vitals:   12/28/12 1545  BP: 147/86  Pulse: 81  Temp: 98.4 F (36.9 C)  Resp: 18     General:  alaert  Eyes: eom-i  ENT: no oral ulcers   Neck: supple   Cardiovascular: s1,s2 rrr  Respiratory: CTA BL   Abdomen: soft, nt, nd   Skin: no rash   Musculoskeletal: BL BKA  Psychiatric: no hallucinations   Neurologic: CN 2-12 intact; motor 5/5 in UE  Labs on Admission:  Basic Metabolic Panel: No results found  for this basename: NA, K, CL, CO2, GLUCOSE, BUN, CREATININE, CALCIUM, MG, PHOS,  in the last 168 hours Liver Function Tests: No results found for this basename: AST, ALT, ALKPHOS, BILITOT, PROT, ALBUMIN,  in the last 168 hours No results found for this basename: LIPASE, AMYLASE,  in the last 168 hours No results found for this basename: AMMONIA,  in the last 168 hours CBC: No results found for this basename: WBC, NEUTROABS, HGB, HCT, MCV, PLT,  in the last 168 hours Cardiac Enzymes: No results found for this basename: CKTOTAL, CKMB, CKMBINDEX, TROPONINI,  in the last 168 hours  BNP (last 3 results) No results found for this basename: PROBNP,  in the last 8760 hours CBG: No results found for this basename: GLUCAP,  in the last 168 hours  Radiological Exams on Admission: No results found.  EKG: Independently reviewed. Not done   Assessment/Plan Principal Problem:   PAD (peripheral artery disease) Active Problems:   S/P BKA (below knee amputation) bilateral   CHF (congestive heart failure)   COPD (chronic obstructive pulmonary disease)   Opioid dependence   Chronic pain syndrome   Seizures  47 y.o. male with PMH of cardiomyopathy, CHF, COPD, CKD, chronic pain syndrome, opioid dependance, h/o GIB, PAD s/p BL BKA (not on antiplatelets due to GIB) had worsening of L leg pain above knee; physician at [facility] ordered doppler which showed no blood flow at L stump then he has referred for admission and vascular surgery evaluation;    1. PAD s/p BL BKA (not on antiplatelets due to GIB)  -no s/s of acute ishemia; cont pain control;  -d/w vascular who kindly agreed to see the patient   2. Cardiomyopathy, CHF; (LVEF 20% per patient, no recent echo available) -clinically euvolemic, not on diuretics; cont statin BB, ACE  3. COPD, smoker; no s/s of exacerbation -stop smoking; prn inhalers  4. Chronic pain, opioid dependence  -cont methadone, fentanyl, prn percocet; avoid overdose, use valium  prn only    Labs from ED: wbc-6.2; RBC 4.7; Hg-33.3; Na-134, K-3.7, Cr-1.34  vascular surgery:  if consultant consulted, please document name and whether formally or informally consulted  Code Status: full (must indicate code status--if unknown or must be presumed, indicate so) Family Communication: none at the bedside (indicate person spoken with, if applicable, with phone number if by telephone) Disposition Plan: [facility] likely in AM, if no procedure planned by vascular  (indicate anticipated LOS)  Time spent: >45 minutes  Esperanza Sheets [facility] Pager [phone]  If 7PM-7AM, please contact night-coverage [email or web address] Password [email or web address] 12/28/2012, 4:28 PM

## 2012-12-29 ENCOUNTER — Observation Stay (HOSPITAL_COMMUNITY): Payer: Medicare Other

## 2012-12-29 DIAGNOSIS — M79609 Pain in unspecified limb: Secondary | ICD-10-CM

## 2012-12-29 LAB — BASIC METABOLIC PANEL
BUN: 26 mg/dL — ABNORMAL HIGH (ref 6–23)
CO2: 22 mEq/L (ref 19–32)
Calcium: 9.7 mg/dL (ref 8.4–10.5)
Creatinine, Ser: 1.32 mg/dL (ref 0.50–1.35)
GFR calc non Af Amer: 64 mL/min — ABNORMAL LOW (ref >90)
Glucose, Bld: 104 mg/dL — ABNORMAL HIGH (ref 70–99)
Sodium: 133 mEq/L — ABNORMAL LOW (ref 135–145)

## 2012-12-29 LAB — CBC
HCT: 33.3 % — ABNORMAL LOW (ref 39.0–52.0)
MCH: 27.4 pg (ref 26.0–34.0)
MCHC: 33 g/dL (ref 30.0–36.0)
MCV: 82.8 fL (ref 78.0–100.0)
Platelets: 417 10*3/uL — ABNORMAL HIGH (ref 150–400)
RDW: 18 % — ABNORMAL HIGH (ref 11.5–15.5)
WBC: 6.1 10*3/uL (ref 4.0–10.5)

## 2012-12-29 LAB — MRSA PCR SCREENING: MRSA by PCR: NEGATIVE

## 2012-12-29 MED ORDER — SENNA 8.6 MG PO TABS: 1.0000 | ORAL_TABLET | Freq: Two times a day (BID) | ORAL | Status: DC

## 2012-12-29 MED ORDER — IOHEXOL 350 MG/ML SOLN
80.0000 mL | Freq: Once | INTRAVENOUS | Status: AC | PRN
  Administered 2012-12-29: 80 mL via INTRAVENOUS

## 2012-12-29 MED ORDER — MORPHINE SULFATE 2 MG/ML IJ SOLN
2.0000 mg | Freq: Once | INTRAMUSCULAR | Status: AC
  Administered 2012-12-29: 2 mg via INTRAVENOUS
  Filled 2012-12-29: qty 1

## 2012-12-29 MED ORDER — FENTANYL 100 MCG/HR TD PT72: 100.0000 ug | MEDICATED_PATCH | TRANSDERMAL | Status: DC

## 2012-12-29 MED ORDER — OXYCODONE HCL 5 MG PO TABS
10.0000 mg | ORAL_TABLET | ORAL | Status: DC | PRN
  Administered 2012-12-29 – 2012-12-31 (×7): 10 mg via ORAL
  Filled 2012-12-29 (×7): qty 2

## 2012-12-29 MED ORDER — METHADONE HCL 10 MG PO TABS: 30.0000 mg | ORAL_TABLET | Freq: Three times a day (TID) | ORAL | Status: DC

## 2012-12-29 MED ORDER — METHADONE HCL 10 MG PO TABS
30.0000 mg | ORAL_TABLET | Freq: Three times a day (TID) | ORAL | Status: DC
  Administered 2012-12-29 (×2): 30 mg via ORAL
  Filled 2012-12-29 (×2): qty 3

## 2012-12-29 MED ORDER — DSS 100 MG PO CAPS: 100.0000 mg | ORAL_CAPSULE | Freq: Two times a day (BID) | ORAL | Status: DC

## 2012-12-29 MED ORDER — OXYCODONE HCL 10 MG PO TABS: 10.0000 mg | ORAL_TABLET | ORAL | Status: DC | PRN

## 2012-12-29 NOTE — Consult Note (Signed)
Consult Note  Patient name: [name] MRN: [id number] DOB: 07/15/1965 Sex: male  Consulting Physician:  Hospitalist service  Reason for Consult: No chief complaint on file.   HISTORY OF PRESENT ILLNESS: This is a 47 year old gentleman who is status post revascularization which ultimately failed leading to bilateral below knee amputations.  The latest amputation was performed by Dr. Edilia Bo in 2008.  He has not had any followup with our office since that time.  Per the patient, he did undergo some form of revascularization in [place].  From what he describes this is a 4 go femoral bypass.  The patient reports having worsening pain in his legs and in the stump bilaterally.  He describes the pain on the left leg as being a stabbing-type pain that goes from the groin to the knee.  He didn't describes muscle cramping.  It is worse with his legs are in bed.  Symptoms are relieved 20 Hanks his counts over the side of the bed.  He had a ultrasound performed in [place] which showed no blood flow and for that reason he was sent to [facility].  The patient is not on antiplatelet therapy secondary to a GI bleed.  The patient has multiple medical problems.  He suffers from COPD and is an active smoker.  His peripheral vascular disease as outlined above.  He also has a history of seizures.  He is medically managed for hypertension.  From coronary perspective he has congestive heart failure as well as coronary artery disease.  His most recent ejection fraction was 20% per the patient.  Past Medical History  Diagnosis Date  . COPD (chronic obstructive pulmonary disease)   . Peripheral vascular disease   . CHF (congestive heart failure)   . Seizures   . Anemia   . Coronary artery disease   . Hypertension   . Depression     Past Surgical History  Procedure Laterality Date  . Vascular surgery      History   Social History  . Marital Status: Legally Separated    Spouse Name: N/A   Number of Children: N/A  . Years of Education: N/A   Occupational History  . Not on file.   Social History Main Topics  . Smoking status: Current Every Day Smoker -- 0.50 packs/day for 30 years    Types: Cigarettes  . Smokeless tobacco: Current User  . Alcohol Use: No     Comment: hasn't in 9 months  . Drug Use: Yes    Special: Marijuana  . Sexual Activity: Not Currently   Other Topics Concern  . Not on file   Social History Narrative  . No narrative on file   Family History:none  Allergies as of 12/28/2012  . (No Known Allergies)    No current facility-administered medications on file prior to encounter.   No current outpatient prescriptions on file prior to encounter.     REVIEW OF SYSTEMS: Please see review of systems from admission history and physical on 12/28/2012.  There are no changes.  PHYSICAL EXAMINATION: General: The patient appears their stated age.  Vital signs are BP 95/65  Pulse 80  Temp(Src) 98.8 F (37.1 C) (Oral)  Resp 18  Ht 5\' 10"  (1.778 m)  Wt 112 lb (50.803 kg)  BMI 16.07 kg/m2  SpO2 100% Pulmonary: Respirations are non-labored HEENT:  No gross abnormalities Abdomen: Soft and non-tender  Musculoskeletal: Bilateral below-knee amputation stumps are well healed.  There is a scab on the stump on the right. Neurologic: No focal weakness or paresthesias are detected, Skin: There are no ulcer or rashes noted. Psychiatric: The patient has normal affect. Cardiovascular: There is a regular rate and rhythm without significant murmur appreciated.  Diagnostic Studies: None   Assessment:  Bilateral pain and bilateral below knee amputation Plan: The patient reports having symptoms similar to when he underwent bypass graft and hand the that relieved his pain.  It is unclear to me what former revascularization he underwent but based on his incisions this was likely a 4 go femoral bypass graft.  I'm not sure whether this is still functioning or not.   I feel the best course of action is to proceed with a CT angiogram of the chest abdomen and pelvis with bilateral runoff to define his anatomy.  It is certainly conceivable that his symptoms are related to poor blood flow.  I also think he has narcotic dependence and chronic pain syndrome.  He states that he has had similar pain in the past but that revascularization improve his symptoms.  We will followup with additional recommendations once the CT scan has been performed.   Patient will need to be optimized medically.  The hernia cholesterol profile checked in that this is elevated I would start him on a statin.  Blood pressure needs to be controlled.  The patient's chronic pain.  He may benefit from pain service evaluation.  He reports being on methadone.   Jorge Ny, M.D. [facility] Office: [phone] Pager:  [phone]

## 2012-12-29 NOTE — Consult Note (Signed)
Full consult to follow.  The patient was transferred for vascular evaluation.  He has pain in both of his below-knee amputation stumps.  I will obtain a CT angiogram to better define his anatomy to determine if any intervention is required.

## 2012-12-29 NOTE — Progress Notes (Signed)
Pt is complaining of pain primarily in left leg.  Stated it feels like electric shock in it.  Pt asked for more pain medication for management of this.  And would like am Physician to increase medication to manage this type of pain.  Please address.  Call and received one time dose of morphine 2mg  IV for pain management at 0600.  [name].

## 2012-12-29 NOTE — Progress Notes (Signed)
UR completed 

## 2012-12-29 NOTE — Progress Notes (Signed)
[facility] PROGRESS NOTE  CHAE OOMMEN RUE:[id number] DOB: 1965-07-09 DOA: 12/28/2012 PCP: Evelene Croon, MD  Assessment/Plan: 47 y.o. male with PMH of cardiomyopathy, CHF, COPD, CKD, chronic pain syndrome, opioid dependance, h/o GIB, PAD s/p BL BKA (not on antiplatelets due to GIB) had worsening of L leg pain above knee; physician [facility] ordered doppler which showed no blood flow at L stump then he has referred for admission and vascular surgery evaluation;   1. PAD s/p BL BKA (not on antiplatelets due to GIB)  -no s/s of acute ishemia; cont pain control;  -angio today per [facility] 2. Cardiomyopathy, CHF; (LVEF 20% per patient, no recent echo available)  -clinically euvolemic, not on diuretics; cont statin BB, hold ACE with angio 3. COPD, smoker; no s/s of exacerbation  -stop smoking; prn inhalers  4. Chronic pain, opioid dependence  -cont methadone, fentanyl, prn percocet; avoid overdose, use valium prn only  -patient reports being tolerant, still in pain requested to increase methadone to 30, and oxy to 10; monitor for overdose   Labs from ED: wbc-6.2; RBC 4.7; Hg-33.3; Na-134, K-3.7, Cr-1.34   Code Status: full Family Communication: none at the bedside (indicate person spoken with, relationship, and if by phone, the number) Disposition Plan: home when cleared by vascular    Consultants:  Vascular   Procedures:  None   Antibiotics:  Non e (indicate start date, and stop date if known)  HPI/Subjective: alert  Objective: Filed Vitals:   12/29/12 1032  BP: 103/69  Pulse: 87  Temp: 98.8 F (37.1 C)  Resp: 18   No intake or output data in the 24 hours ending 12/29/12 1107 Filed Weights   12/28/12 2004  Weight: 50.803 kg (112 lb)    Exam:   General:  alert  Cardiovascular: s1,s2 rrr  Respiratory: CTA BL  Abdomen: soft, nt nd   Musculoskeletal: BL BKA   Data Reviewed: Basic Metabolic Panel: No results found for this basename: NA, K, CL,  CO2, GLUCOSE, BUN, CREATININE, CALCIUM, MG, PHOS,  in the last 168 hours Liver Function Tests: No results found for this basename: AST, ALT, ALKPHOS, BILITOT, PROT, ALBUMIN,  in the last 168 hours No results found for this basename: LIPASE, AMYLASE,  in the last 168 hours No results found for this basename: AMMONIA,  in the last 168 hours CBC: No results found for this basename: WBC, NEUTROABS, HGB, HCT, MCV, PLT,  in the last 168 hours Cardiac Enzymes: No results found for this basename: CKTOTAL, CKMB, CKMBINDEX, TROPONINI,  in the last 168 hours BNP (last 3 results) No results found for this basename: PROBNP,  in the last 8760 hours CBG: No results found for this basename: GLUCAP,  in the last 168 hours  Recent Results (from the past 240 hour(s))  MRSA PCR SCREENING     Status: None   Collection Time    12/29/12  5:52 AM      Result Value Range Status   MRSA by PCR NEGATIVE  NEGATIVE Final   Comment:            The GeneXpert MRSA Assay (FDA     approved for NASAL specimens     only), is one component of a     comprehensive MRSA colonization     surveillance program. It is not     intended to diagnose MRSA     infection nor to guide or     monitor treatment for     MRSA infections.  Studies: No results found.  Scheduled Meds: . atorvastatin  20 mg Oral Daily  . carvedilol  25 mg Oral BID WC  . cholecalciferol  2,000 Units Oral Daily  . docusate sodium  100 mg Oral BID  . fentaNYL  100 mcg Transdermal Q72H  . ferrous sulfate  325 mg Oral BID  . lisinopril  2.5 mg Oral Daily  . megestrol  400 mg Oral Daily  . methadone  20 mg Oral TID  . pantoprazole  40 mg Oral Daily  . phenytoin  200 mg Oral TID  . senna  1 tablet Oral BID   Continuous Infusions:   Principal Problem:   PAD (peripheral artery disease) Active Problems:   S/P BKA (below knee amputation) bilateral   CHF (congestive heart failure)   COPD (chronic obstructive pulmonary disease)   Opioid  dependence   Chronic pain syndrome   Seizures    Time spent: > 35 minutes     Esperanza Sheets  [facility] Pager [phone]. If 7PM-7AM, please contact night-coverage at [email or web address], password [facility] 12/29/2012, 11:07 AM  LOS: 1 day

## 2012-12-30 DIAGNOSIS — I2699 Other pulmonary embolism without acute cor pulmonale: Secondary | ICD-10-CM

## 2012-12-30 MED ORDER — METHADONE HCL 10 MG PO TABS
40.0000 mg | ORAL_TABLET | Freq: Once | ORAL | Status: AC
  Filled 2012-12-30: qty 4

## 2012-12-30 MED ORDER — METHADONE HCL 10 MG PO TABS
40.0000 mg | ORAL_TABLET | Freq: Three times a day (TID) | ORAL | Status: DC
  Administered 2012-12-30 – 2012-12-31 (×4): 40 mg via ORAL
  Filled 2012-12-30 (×3): qty 4

## 2012-12-30 MED ORDER — CEFAZOLIN SODIUM 1-5 GM-% IV SOLN
1.0000 g | INTRAVENOUS | Status: DC
  Filled 2012-12-30: qty 50

## 2012-12-30 NOTE — Progress Notes (Signed)
[facility] PROGRESS NOTE  [name] ZOX:[id number] DOB: [birth year] DOA: 12/28/2012 PCP: Evelene Croon, MD  Assessment/Plan: 47 y.o. male with PMH of cardiomyopathy, CHF, COPD, CKD, chronic pain syndrome, opioid dependance, h/o GIB, PAD s/p BL BKA (not on antiplatelets due to GIB) had worsening of L leg pain above knee; [facility] ordered doppler which showed no blood flow at L stump then he has referred for admission and vascular surgery evaluation;   1. PAD s/p BL BKA (not on antiplatelets due to GIB)  -no s/s of acute ishemia; cont pain control;  -CT angio: chronic multiple occulusion; appreciate  VVS input; patient is thinking about options   2. Cardiomyopathy, CHF; (LVEF 20% per patient, no recent echo available)  -clinically euvolemic, not on diuretics; cont statin BB, hold ACE with angio  3. COPD, smoker; no s/s of exacerbation  -stop smoking; prn inhalers   4. Chronic pain, opioid dependence  -cont methadone, fentanyl, prn percocet; avoid overdose, use valium prn only  -patient reports being tolerant, still in pain requested to increase methadone to 40, and oxy to 10; monitor for overdose   5. 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;  -patient had h/o PE, not on anticoagulation due to sever GIB;  -d/w vascular will obtain DVT US; evaluated for filter; no anticoagulation at this time; d/w agreed with patient  Patient is DNR; he does not want any resuscitation; d/w with him at the bedside    Labs from ED: wbc-6.2; RBC 4.7; Hg-33.3; Na-134, K-3.7, Cr-1.34   Code Status: full Family Communication: none at the bedside (indicate person spoken with, relationship, and if by phone, the number) Disposition Plan: home when cleared by vascular    Consultants:  Vascular   Procedures:  None   Antibiotics:  Non e (indicate start date, and stop date if known)  HPI/Subjective: alert  Objective: Filed Vitals:   12/30/12 0549  BP: 99/61  Pulse: 85  Temp: 98.5  F (36.9 C)  Resp: 20   No intake or output data in the 24 hours ending 12/30/12 1026 Filed Weights   12/28/12 2004  Weight: 50.803 kg (112 lb)    Exam:   General:  alert  Cardiovascular: s1,s2 rrr  Respiratory: CTA BL  Abdomen: soft, nt nd   Musculoskeletal: BL BKA   Data Reviewed: Basic Metabolic Panel:  Recent Labs Lab 12/29/12 1023  NA 133*  K 3.8  CL 97  CO2 22  GLUCOSE 104*  BUN 26*  CREATININE 1.32  CALCIUM 9.7   Liver Function Tests: No results found for this basename: AST, ALT, ALKPHOS, BILITOT, PROT, ALBUMIN,  in the last 168 hours No results found for this basename: LIPASE, AMYLASE,  in the last 168 hours No results found for this basename: AMMONIA,  in the last 168 hours CBC:  Recent Labs Lab 12/29/12 1023  WBC 6.1  HGB 11.0*  HCT 33.3*  MCV 82.8  PLT 417*   Cardiac Enzymes: No results found for this basename: CKTOTAL, CKMB, CKMBINDEX, TROPONINI,  in the last 168 hours BNP (last 3 results) No results found for this basename: PROBNP,  in the last 8760 hours CBG: No results found for this basename: GLUCAP,  in the last 168 hours  Recent Results (from the past 240 hour(s))  MRSA PCR SCREENING     Status: None   Collection Time    12/29/12  5:52 AM      Result Value Range Status   MRSA by PCR NEGATIVE  NEGATIVE Final   Comment:            The GeneXpert MRSA Assay (FDA     approved for NASAL specimens     only), is one component of a     comprehensive MRSA colonization     surveillance program. It is not     intended to diagnose MRSA     infection nor to guide or     monitor treatment for     MRSA infections.     Studies: Ct Angio Ao+bifem W/cm &/or Wo/cm  12/29/2012   CLINICAL DATA:  Bilateral below-knee amputations, chronic peripheral vascular disease, status post prior aorto femoral and femoral to femoral bypass.  EXAM: CT ANGIOGRAPHY CHEST, ABDOMEN AND PELVIS including lower extremity runoff  TECHNIQUE: Multidetector CT  imaging through the chest, abdomen, pelvis and lower extremities was performed using the standard protocol during bolus administration of intravenous contrast. Multiplanar reconstructed images including MIPs were obtained and reviewed to evaluate the vascular anatomy.  CONTRAST:  100 OMNIPAQUE IOHEXOL 350 MG/ML SOLN  COMPARISON:  02/24/2007 CT chest  FINDINGS: CTA CHEST FINDINGS  Intact thoracic aorta. Minor atherosclerotic change of the ascending aorta and transverse aorta. Major branch vessels remain patent. Descending thoracic aorta is normal proximally. Distal descending thoracic aorta above the diaphragmatic hiatus demonstrates aneurysmal dilatation measuring 4.0 x 4.5 cm, image 83. Mural thrombus noted diffusely. Native lumen remains patent.  Small branching filling defect noted within a right lower lobe pulmonary segmental artery, images 61-65. This is compatible with a right lower lobe pulmonary embolus. Very minimal thrombus burden. No large proximal or central pulmonary embolus.  Negative for adenopathy. Normal heart size. No pericardial or pleural effusion. Distal esophagus demonstrates circumferential wall thickening and a small hernia. This can be seen with reflux esophagitis.  Lung windows demonstrate mixed centrilobular and paraseptal emphysema. Small apical blebs noted. Minor basilar atelectasis. No acute airspace process, collapse or consolidation. No suspicious pulmonary mass or nodule.  No acute osseous finding.  Review of the MIP images confirms the above findings.  CTA ABDOMEN AND PELVIS FINDINGS  Proximal abdominal aorta demonstrates similar aneurysmal dilatation which tapers to chronic appearing occlusion at the level of the right renal artery. Above the occlusion, the celiac, SMA, and right renal artery are patent. Left renal artery is not visualized compatible with conclusion. Infrarenal aorto to left femoral bypass conduit is chronically occluded. Left femoral to right femoral bypass is  also occluded across the anterior pelvis. There is reconstitution of the lower extremity profunda femoral branches via the inferior epigastric pathways bilaterally and also the superior hemorrhoidal to internal iliac pathways. the native common femoral and superficial femoral arteries are occluded bilaterally.  Review of the MIP images confirms the above findings.  Nonvascular findings:  Liver, gallbladder, biliary system, pancreas, and adrenal glands are within normal limits for age and arterial phase imaging. Spleen demonstrates scattered calcifications compatible with remote granulomatous disease. Right kidney demonstrates inferior cortical scarring but normal enhancement without obstruction. Left kidney demonstrates very poor profusion from chronic left renal artery occlusion and diffuse atrophy.  Negative for bowel obstruction, dilatation, ileus, free air, fluid collection, abscess, or hemorrhage.  Small amount of dependent pelvic fluid, nonspecific. No acute distal bowel process. Urinary bladder unremarkable.  Patient is status post prior below-knee amputations bilaterally. Fixation hardware noted of the left hip. No acute osseous finding of the thoracic or lumbar spine.  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  Chronic occlusion of the left to right femoral bypass  Preserved patency of the celiac, SMA, and right renal artery.  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  Small right lower lobe pulmonary embolus.   Electronically Signed   By: Ruel Favors M.D.   On: 12/29/2012 17:13   Ct Angio Chest Aorta W/cm &/or Wo/cm  12/29/2012   CLINICAL DATA:  Bilateral  below-knee amputations, chronic peripheral vascular disease, status post prior aorto femoral and femoral to femoral bypass.  EXAM: CT ANGIOGRAPHY CHEST, ABDOMEN AND PELVIS including lower extremity runoff  TECHNIQUE: Multidetector CT imaging through the chest, abdomen, pelvis and lower extremities was performed using the standard protocol during bolus administration of intravenous contrast. Multiplanar reconstructed images including MIPs were obtained and reviewed to evaluate the vascular anatomy.  CONTRAST:  100 OMNIPAQUE IOHEXOL 350 MG/ML SOLN  COMPARISON:  02/24/2007 CT chest  FINDINGS: CTA CHEST FINDINGS  Intact thoracic aorta. Minor atherosclerotic change of the ascending aorta and transverse aorta. Major branch vessels remain patent. Descending thoracic aorta is normal proximally. Distal descending thoracic aorta above the diaphragmatic hiatus demonstrates aneurysmal dilatation measuring 4.0 x 4.5 cm, image 83. Mural thrombus noted diffusely. Native lumen remains patent.  Small branching filling defect noted within a right lower lobe pulmonary segmental artery, images 61-65. This is compatible with a right lower lobe pulmonary embolus. Very minimal thrombus burden. No large proximal or central pulmonary embolus.  Negative for adenopathy. Normal heart size. No pericardial or pleural effusion. Distal esophagus demonstrates circumferential wall thickening and a small hernia. This can be seen with reflux esophagitis.  Lung windows demonstrate mixed centrilobular and paraseptal emphysema. Small apical blebs noted. Minor basilar atelectasis. No acute airspace process, collapse or consolidation. No suspicious pulmonary mass or nodule.  No acute osseous finding.  Review of the MIP images confirms the above findings.  CTA ABDOMEN AND PELVIS FINDINGS  Proximal abdominal aorta demonstrates similar aneurysmal dilatation which tapers to chronic appearing occlusion at the level of the right renal artery. Above the  occlusion, the celiac, SMA, and right renal artery are patent. Left renal artery is not visualized compatible with conclusion. Infrarenal aorto to left femoral bypass conduit is chronically occluded. Left femoral to right femoral bypass is also occluded across the anterior pelvis. There is reconstitution of the lower extremity profunda femoral branches via the inferior epigastric pathways bilaterally and also the superior hemorrhoidal to internal iliac pathways. the native common femoral and superficial femoral arteries are occluded bilaterally.  Review of the MIP images confirms the above findings.  Nonvascular findings:  Liver, gallbladder, biliary system, pancreas, and adrenal glands are within normal limits for age and arterial phase imaging. Spleen demonstrates scattered calcifications compatible with remote granulomatous disease. Right kidney demonstrates inferior cortical scarring but normal enhancement without obstruction. Left kidney demonstrates very poor profusion from chronic left renal artery occlusion and diffuse atrophy.  Negative for bowel obstruction, dilatation, ileus, free air, fluid collection, abscess, or hemorrhage.  Small amount of dependent pelvic fluid, nonspecific. No acute distal bowel process. Urinary bladder unremarkable.  Patient is status post prior below-knee amputations bilaterally. Fixation hardware noted of the left hip. No acute osseous finding of the thoracic or lumbar spine.  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  Chronic occlusion of the left to right femoral bypass  Preserved patency of the celiac, SMA, and right renal artery.  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  Small right lower lobe pulmonary embolus.   Electronically Signed   By: Ruel Favors M.D.   On: 12/29/2012 17:13    Scheduled Meds: . atorvastatin  20 mg Oral Daily  . carvedilol  25 mg Oral BID WC  . cholecalciferol  2,000 Units Oral Daily  . docusate sodium  100 mg Oral BID  . fentaNYL  100 mcg Transdermal Q72H  . ferrous sulfate  325 mg Oral BID  . megestrol  400 mg Oral Daily  . methadone  30 mg Oral TID  . pantoprazole  40 mg Oral Daily  . phenytoin  200 mg Oral TID  . senna  1 tablet Oral BID   Continuous Infusions:   Principal Problem:   PAD (peripheral artery disease) Active Problems:   S/P BKA (below knee amputation) bilateral   CHF (congestive heart failure)   COPD (chronic obstructive pulmonary disease)   Opioid dependence   Chronic pain syndrome   Seizures    Time spent: > 35 minutes     Esperanza Sheets  [facility] Pager [phone]. If 7PM-7AM, please contact night-coverage at [email or web address], password [facility] 12/30/2012, 10:26 AM  LOS: 2 days

## 2012-12-30 NOTE — Progress Notes (Signed)
   VASCULAR PROGRESS NOTE  SUBJECTIVE: No significant change in pain in both BKA stumps. (L > R).  PHYSICAL EXAM: Filed Vitals:   12/29/12 1403 12/29/12 1700 12/29/12 2207 12/30/12 0549  BP: 95/65 114/68 144/66 99/61  Pulse: 80 73 84 85  Temp: 98.8 F (37.1 C)  98.4 F (36.9 C) 98.5 F (36.9 C)  TempSrc: Oral  Oral Oral  Resp: 18  20 20   Height:      Weight:      SpO2: 100%  100% 100%   Both stumps have good temperature and are not cool.   LABS: Lab Results  Component Value Date   WBC 6.1 12/29/2012   HGB 11.0* 12/29/2012   HCT 33.3* 12/29/2012   MCV 82.8 12/29/2012   PLT 417* 12/29/2012   Lab Results  Component Value Date   CREATININE 1.32 12/29/2012   CT SCAN: Occluded Thoracofemoral bypass. Bilat SFA occlusions. Small DFA patent bilat.   He tells me that he has had extensive cardiac w/u in [facility] and in [place] and has a poor EF (20% ?)  CBG (last 3)   Principal Problem:   PAD (peripheral artery disease) Active Problems:   S/P BKA (below knee amputation) bilateral   CHF (congestive heart failure)   COPD (chronic obstructive pulmonary disease)   Opioid dependence   Chronic pain syndrome   Seizures  ASSESSMENT AND PLAN:  If his pain cannot be adequately controlled then I have explained that there are 2 options.  1. Axillobifemoral bypass. However, given that the Thoracofemoral bypass failed, I suspect that the issue is poor outflow. This combined with severe CHF and a low EF make me think that another bypass has little chance for long term success.   2. Bilat AKA's. He is very much opposed to this and is fearful of losing his mobility.  I have also had a long discussion with him about tobacco cessation. We discussed the effect of nicotine on vasospasm. I believe that if he were able to get off nicotine completely, that this would significantly help his pain.  From our standpoint, if the pain is tolerable and he wants to go home, we could follow  this as an outpt.  Will follow.   Cari Caraway Beeper: [phone] 12/30/2012

## 2012-12-30 NOTE — Progress Notes (Signed)
   VASCULAR ADDENDUM:  Radiology noted small PE in Right lower lobe. Therefore, bilat LE venous duplex scan was performed. This shows DVT in the Left CFV, FV, and Popliteal vein. He has a history of an esophageal bleed and has a contraindication to anticoagulation. Therefore, I have recommended placement of an IVC filter. I have discussed the indications for filter placement. I have also discussed the potential complications of the procedure including, but not limited to bleeding, IVC thrombosis, PE, filter migration, and risk of long term problems. Would favor leaving filter in permanently, given his long term risk for DVT/PE because of limited mobility and contraindication to anticoagulation. All of his questions were answered and he is agreeable to proceed with surgery in the AM.  Chris Dickson Beeper: [phone] 12/30/2012      

## 2012-12-30 NOTE — Progress Notes (Signed)
VASCULAR LAB PRELIMINARY  PRELIMINARY  PRELIMINARY  PRELIMINARY  Bilateral lower extremity venous duplex  completed.    Preliminary report:  Right:  No evidence of DVT, superficial thrombosis, or Baker's cyst.  Left: DVT noted in the CFV, FV and popliteal v.  Non occlusive in the CFV.  No evidence of superficial thrombosis.  No Baker's cyst.   Jeanpaul Biehl, RVT 12/30/2012, 12:17 PM

## 2012-12-31 ENCOUNTER — Observation Stay (HOSPITAL_COMMUNITY): Payer: Medicare Other | Admitting: Anesthesiology

## 2012-12-31 ENCOUNTER — Encounter (HOSPITAL_COMMUNITY): Payer: Medicare Other | Admitting: Anesthesiology

## 2012-12-31 ENCOUNTER — Encounter (HOSPITAL_COMMUNITY)
Admission: AD | Disposition: A | Discharge status: Skilled Nursing Facility | Payer: Self-pay | Source: Other Acute Inpatient Hospital | Attending: Internal Medicine

## 2012-12-31 DIAGNOSIS — S88119A Complete traumatic amputation at level between knee and ankle, unspecified lower leg, initial encounter: Secondary | ICD-10-CM

## 2012-12-31 DIAGNOSIS — I2699 Other pulmonary embolism without acute cor pulmonale: Secondary | ICD-10-CM

## 2012-12-31 HISTORY — PX: VENA CAVA FILTER PLACEMENT: SHX1085

## 2012-12-31 SURGERY — INSERTION, FILTER, VENA CAVA
Anesthesia: General | Wound class: Clean

## 2012-12-31 MED ORDER — CEFAZOLIN SODIUM-DEXTROSE 2-3 GM-% IV SOLR
INTRAVENOUS | Status: DC | PRN
  Administered 2012-12-31: 2 g via INTRAVENOUS

## 2012-12-31 MED ORDER — LACTATED RINGERS IV SOLN
INTRAVENOUS | Status: DC | PRN
  Administered 2012-12-31: 10:00:00 via INTRAVENOUS

## 2012-12-31 MED ORDER — OXYCODONE HCL 5 MG PO TABS
ORAL_TABLET | ORAL | Status: AC
  Filled 2012-12-31: qty 1

## 2012-12-31 MED ORDER — PROPOFOL 10 MG/ML IV BOLUS
INTRAVENOUS | Status: DC | PRN
  Administered 2012-12-31: 200 mg via INTRAVENOUS

## 2012-12-31 MED ORDER — OXYCODONE HCL 5 MG/5ML PO SOLN: 5.0000 mg | Freq: Once | ORAL | Status: AC | PRN

## 2012-12-31 MED ORDER — FENTANYL CITRATE 0.05 MG/ML IJ SOLN
INTRAMUSCULAR | Status: AC
  Administered 2012-12-31: 25 ug via INTRAVENOUS
  Filled 2012-12-31: qty 2

## 2012-12-31 MED ORDER — IODIXANOL 320 MG/ML IV SOLN
INTRAVENOUS | Status: DC | PRN
  Administered 2012-12-31: 59.9 mL via INTRAVENOUS

## 2012-12-31 MED ORDER — OXYCODONE HCL 10 MG PO TABS: 10.0000 mg | ORAL_TABLET | ORAL | Status: DC | PRN

## 2012-12-31 MED ORDER — OXYCODONE HCL 5 MG PO TABS
5.0000 mg | ORAL_TABLET | Freq: Once | ORAL | Status: AC | PRN
Start: 2012-12-31 — End: 2012-12-31
  Administered 2012-12-31: 5 mg via ORAL

## 2012-12-31 MED ORDER — CEFAZOLIN SODIUM 1-5 GM-% IV SOLN
INTRAVENOUS | Status: AC
  Filled 2012-12-31: qty 50

## 2012-12-31 MED ORDER — DIAZEPAM 5 MG PO TABS: 2.5000 mg | ORAL_TABLET | Freq: Three times a day (TID) | ORAL | Status: DC | PRN

## 2012-12-31 MED ORDER — SODIUM CHLORIDE 0.9 % IR SOLN
Status: DC | PRN
  Administered 2012-12-31: 09:00:00

## 2012-12-31 MED ORDER — SODIUM CHLORIDE 0.9 % IR SOLN
Status: DC | PRN
  Administered 2012-12-31: 1000 mL

## 2012-12-31 MED ORDER — FENTANYL CITRATE 0.05 MG/ML IJ SOLN
25.0000 ug | INTRAMUSCULAR | Status: DC | PRN
  Administered 2012-12-31 (×4): 25 ug via INTRAVENOUS

## 2012-12-31 MED ORDER — FENTANYL 100 MCG/HR TD PT72: 100.0000 ug | MEDICATED_PATCH | TRANSDERMAL | Status: DC

## 2012-12-31 MED ORDER — METHADONE HCL 10 MG PO TABS: 40.0000 mg | ORAL_TABLET | Freq: Three times a day (TID) | ORAL | Status: DC

## 2012-12-31 MED ORDER — LIDOCAINE HCL (CARDIAC) 20 MG/ML IV SOLN
INTRAVENOUS | Status: DC | PRN
  Administered 2012-12-31: 60 mg via INTRAVENOUS

## 2012-12-31 MED ORDER — FENTANYL CITRATE 0.05 MG/ML IJ SOLN
INTRAMUSCULAR | Status: DC | PRN
  Administered 2012-12-31: 50 ug via INTRAVENOUS

## 2012-12-31 MED ORDER — ONDANSETRON HCL 4 MG/2ML IJ SOLN: 4.0000 mg | Freq: Four times a day (QID) | INTRAMUSCULAR | Status: DC | PRN

## 2012-12-31 SURGICAL SUPPLY — 41 items
ADH SKN CLS APL DERMABOND .7 (GAUZE/BANDAGES/DRESSINGS) ×1
BAG DECANTER FOR FLEXI CONT (MISCELLANEOUS) ×2 IMPLANT
BLADE SURG 11 STRL SS (BLADE) ×2 IMPLANT
COVER SURGICAL LIGHT HANDLE (MISCELLANEOUS) ×2 IMPLANT
COVER TABLE BACK 60X90 (DRAPES) ×2 IMPLANT
DECANTER SPIKE VIAL GLASS SM (MISCELLANEOUS) ×1 IMPLANT
DERMABOND ADVANCED (GAUZE/BANDAGES/DRESSINGS) ×1
DERMABOND ADVANCED .7 DNX12 (GAUZE/BANDAGES/DRESSINGS) ×1 IMPLANT
DRAPE C-ARM 42X72 X-RAY (DRAPES) ×1 IMPLANT
DRAPE LAPAROTOMY T 102X78X121 (DRAPES) ×2 IMPLANT
DRSG TEGADERM 2-3/8X2-3/4 SM (GAUZE/BANDAGES/DRESSINGS) ×1 IMPLANT
FILTER VC CELECT-FEMORAL (Filter) ×1 IMPLANT
GLOVE BIO SURGEON STRL SZ7.5 (GLOVE) ×2 IMPLANT
GLOVE BIOGEL PI IND STRL 7.0 (GLOVE) IMPLANT
GLOVE BIOGEL PI IND STRL 8 (GLOVE) ×1 IMPLANT
GLOVE BIOGEL PI INDICATOR 7.0 (GLOVE) ×1
GLOVE BIOGEL PI INDICATOR 8 (GLOVE) ×2
GLOVE SURG SS PI 7.5 STRL IVOR (GLOVE) ×2 IMPLANT
GOWN STRL NON-REIN LRG LVL3 (GOWN DISPOSABLE) ×6 IMPLANT
KIT BASIN OR (CUSTOM PROCEDURE TRAY) ×2 IMPLANT
KIT ROOM TURNOVER OR (KITS) ×2 IMPLANT
NDL HYPO 25GX1X1/2 BEV (NEEDLE) ×1 IMPLANT
NDL PERC 18GX7CM (NEEDLE) ×1 IMPLANT
NEEDLE HYPO 25GX1X1/2 BEV (NEEDLE) ×2 IMPLANT
NEEDLE PERC 18GX7CM (NEEDLE) ×2 IMPLANT
NS IRRIG 1000ML POUR BTL (IV SOLUTION) ×2 IMPLANT
PACK SURGICAL SETUP 50X90 (CUSTOM PROCEDURE TRAY) ×2 IMPLANT
PAD ARMBOARD 7.5X6 YLW CONV (MISCELLANEOUS) ×4 IMPLANT
SPONGE GAUZE 4X4 12PLY (GAUZE/BANDAGES/DRESSINGS) ×2 IMPLANT
STOPCOCK MORSE 400PSI 3WAY (MISCELLANEOUS) ×1 IMPLANT
SUT VICRYL 4-0 PS2 18IN ABS (SUTURE) ×2 IMPLANT
SYR 20ML ECCENTRIC (SYRINGE) ×2 IMPLANT
SYR 30ML LL (SYRINGE) ×2 IMPLANT
SYR 5ML LL (SYRINGE) ×2 IMPLANT
SYR CONTROL 10ML LL (SYRINGE) ×2 IMPLANT
TOWEL OR 17X24 6PK STRL BLUE (TOWEL DISPOSABLE) ×2 IMPLANT
TOWEL OR 17X26 10 PK STRL BLUE (TOWEL DISPOSABLE) ×2 IMPLANT
TUBING HIGH PRESSURE 120CM (CONNECTOR) ×1 IMPLANT
WATER STERILE IRR 1000ML POUR (IV SOLUTION) ×2 IMPLANT
WIRE BENTSON .035X145CM (WIRE) ×1 IMPLANT
WIRE J 3MM .035X145CM (WIRE) ×2 IMPLANT

## 2012-12-31 NOTE — Discharge Summary (Signed)
Physician Discharge Summary  DRYSTAN READER ZOX:[id number] DOB: 17-Jun-1965 DOA: 12/28/2012  PCP: Evelene Croon, MD  Admit date: 12/28/2012 Discharge date: 12/31/2012  Time spent: >35 minutes  Recommendations for Outpatient Follow-up:  F/u with vascular surgery outpatient in 2-3 weeks F/u with PCP in 1-2 weeks as needed  Discharge Diagnoses:  Principal Problem:   PAD (peripheral artery disease) Active Problems:   S/P BKA (below knee amputation) bilateral   CHF (congestive heart failure)   COPD (chronic obstructive pulmonary disease)   Opioid dependence   Chronic pain syndrome   Seizures   Discharge Condition: stable   Diet recommendation: heart healthy  Filed Weights   12/28/12 2004  Weight: 50.803 kg (112 lb)    History of present illness:  47 y.o. male with PMH of cardiomyopathy, CHF, COPD, CKD, chronic pain syndrome, opioid dependance, h/o GIB, PAD s/p BL BKA (not on antiplatelets due to GIB) had worsening of L leg pain above knee; physician [facility] ordered doppler which showed no blood flow at L stump then he has referred for admission and vascular surgery evaluation;    Hospital Course:  1. PAD s/p BL BKA (not on antiplatelets due to GIB)  -no s/s of acute ishemia; cont pain control;  -CT angio: chronic multiple occulusion; appreciate VVS input; patient decided to f/u with vascular surgery outpatient to discuss option; / future bypass vs more paroxymal amputation   2. Cardiomyopathy, CHF; (LVEF 20% per patient, no recent echo available)  -clinically euvolemic, not on diuretics; cont statin BB 3. COPD, smoker; no s/s of exacerbation  -stop smoking; prn inhalers  4. Chronic pain, opioid dependence  -cont methadone, fentanyl, prn percocet; avoid overdose, use valium prn only   5. 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 Studies not automatically included, echos, thoracentesis, etc; not x-rays)  Consultations:  Vascular surgery   Discharge Exam: Filed Vitals:   12/31/12 1158  BP: 125/77  Pulse: 84  Temp: 98.8 F (37.1 C)  Resp: 16    General: alert Cardiovascular: s1,s2 rrr Respiratory: CTA BL  Discharge Instructions  Discharge Orders   Future Orders Complete By Expires   Diet - low sodium heart healthy  As directed    Discharge instructions  As directed    Comments:     Please follow up with vascular surgeon outpatient   Increase activity slowly  As directed        Medication List         acetaminophen 325 MG tablet  Commonly known as:  TYLENOL  Take 650 mg by mouth every 6 (six) hours as needed for moderate pain.     ANTACID MEDICINE PO  Take 20 mLs by mouth every 4 (four) hours as needed (indigestion).     atorvastatin 20 MG tablet  Commonly known as:  LIPITOR  Take 20 mg by mouth daily.     carvedilol 25 MG tablet  Commonly known as:  COREG  Take 25 mg by mouth 2 (two) times daily with a meal.     diazepam 10 MG tablet  Commonly known as:  VALIUM  Take 10 mg by mouth 3 (three) times daily.  diazepam 5 MG tablet  Commonly known as:  VALIUM  Take 0.5 tablets (2.5 mg total) by mouth 3 (three) times daily as needed for muscle spasms.     DSS 100 MG Caps  Take 100 mg by mouth 2 (two) times daily.     esomeprazole 40 MG capsule  Commonly known as:  NEXIUM  Take 40 mg by mouth daily at 12 noon.     fentaNYL 100 MCG/HR  Commonly known as:  DURAGESIC - dosed mcg/hr  Place 1 patch (100 mcg total) onto the skin every 3 (three) days.     ferrous sulfate 325 (65 FE) MG tablet  Take 325 mg by mouth 2 (two) times daily.     gabapentin 400 MG capsule  Commonly known as:  NEURONTIN  Take 400 mg by mouth 3 (three)  times daily.     lisinopril 2.5 MG tablet  Commonly known as:  PRINIVIL,ZESTRIL  Take 2.5 mg by mouth daily.     megestrol 400 MG/10ML suspension  Commonly known as:  MEGACE  Take 400 mg by mouth daily.     methadone 10 MG tablet  Commonly known as:  DOLOPHINE  Take 4 tablets (40 mg total) by mouth 3 (three) times daily.     MILK OF MAGNESIA PO  Take 30 mLs by mouth daily as needed (constipation).     Oxycodone HCl 10 MG Tabs  Take 1 tablet (10 mg total) by mouth every 4 (four) hours as needed for severe pain.     phenytoin 100 MG ER capsule  Commonly known as:  DILANTIN  Take 200 mg by mouth 3 (three) times daily.     senna 8.6 MG Tabs tablet  Commonly known as:  SENOKOT  Take 1 tablet (8.6 mg total) by mouth 2 (two) times daily.     Vitamin D-3 1000 UNITS Caps  Take 2,000 Units by mouth daily.       No Known Allergies     Follow-up Information   Follow up with Evelene Croon, MD In 1 week.   Specialty:  Family Medicine   Contact information:   [facility] [place] [address] [phone]        The results of significant diagnostics from this hospitalization (including imaging, microbiology, ancillary and laboratory) are listed below for reference.    Significant Diagnostic Studies: Ct Angio Ao+bifem W/cm &/or Wo/cm  12/29/2012   CLINICAL DATA:  Bilateral below-knee amputations, chronic peripheral vascular disease, status post prior aorto femoral and femoral to femoral bypass.  EXAM: CT ANGIOGRAPHY CHEST, ABDOMEN AND PELVIS including lower extremity runoff  TECHNIQUE: Multidetector CT imaging through the chest, abdomen, pelvis and lower extremities was performed using the standard protocol during bolus administration of intravenous contrast. Multiplanar reconstructed images including MIPs were obtained and reviewed to evaluate the vascular anatomy.  CONTRAST:  100 OMNIPAQUE IOHEXOL 350 MG/ML SOLN  COMPARISON:  02/24/2007 CT chest  FINDINGS: CTA CHEST  FINDINGS  Intact thoracic aorta. Minor atherosclerotic change of the ascending aorta and transverse aorta. Major branch vessels remain patent. Descending thoracic aorta is normal proximally. Distal descending thoracic aorta above the diaphragmatic hiatus demonstrates aneurysmal dilatation measuring 4.0 x 4.5 cm, image 83. Mural thrombus noted diffusely. Native lumen remains patent.  Small branching filling defect noted within a right lower lobe pulmonary segmental artery, images 61-65. This is compatible with a right lower lobe pulmonary embolus. Very minimal thrombus burden. No large proximal or central pulmonary embolus.  Negative for adenopathy.  Normal heart size. No pericardial or pleural effusion. Distal esophagus demonstrates circumferential wall thickening and a small hernia. This can be seen with reflux esophagitis.  Lung windows demonstrate mixed centrilobular and paraseptal emphysema. Small apical blebs noted. Minor basilar atelectasis. No acute airspace process, collapse or consolidation. No suspicious pulmonary mass or nodule.  No acute osseous finding.  Review of the MIP images confirms the above findings.  CTA ABDOMEN AND PELVIS FINDINGS  Proximal abdominal aorta demonstrates similar aneurysmal dilatation which tapers to chronic appearing occlusion at the level of the right renal artery. Above the occlusion, the celiac, SMA, and right renal artery are patent. Left renal artery is not visualized compatible with conclusion. Infrarenal aorto to left femoral bypass conduit is chronically occluded. Left femoral to right femoral bypass is also occluded across the anterior pelvis. There is reconstitution of the lower extremity profunda femoral branches via the inferior epigastric pathways bilaterally and also the superior hemorrhoidal to internal iliac pathways. the native common femoral and superficial femoral arteries are occluded bilaterally.  Review of the MIP images confirms the above findings.   Nonvascular findings:  Liver, gallbladder, biliary system, pancreas, and adrenal glands are within normal limits for age and arterial phase imaging. Spleen demonstrates scattered calcifications compatible with remote granulomatous disease. Right kidney demonstrates inferior cortical scarring but normal enhancement without obstruction. Left kidney demonstrates very poor profusion from chronic left renal artery occlusion and diffuse atrophy.  Negative for bowel obstruction, dilatation, ileus, free air, fluid collection, abscess, or hemorrhage.  Small amount of dependent pelvic fluid, nonspecific. No acute distal bowel process. Urinary bladder unremarkable.  Patient is status post prior below-knee amputations bilaterally. Fixation hardware noted of the left hip. No acute osseous finding of the thoracic or lumbar spine.  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  Chronic occlusion of the left to right femoral bypass  Preserved patency of the celiac, SMA, and right renal artery.  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  Small right lower lobe pulmonary embolus.   Electronically Signed   By: Ruel Favors M.D.   On: 12/29/2012 17:13   Ct Angio Chest Aorta W/cm &/or Wo/cm  12/29/2012   CLINICAL DATA:  Bilateral below-knee amputations, chronic peripheral vascular disease, status post prior aorto femoral and femoral to femoral bypass.  EXAM: CT ANGIOGRAPHY CHEST, ABDOMEN AND PELVIS including lower extremity runoff  TECHNIQUE: Multidetector CT imaging through the chest, abdomen, pelvis and lower extremities was performed using the standard protocol during bolus administration of  intravenous contrast. Multiplanar reconstructed images including MIPs were obtained and reviewed to evaluate the vascular anatomy.  CONTRAST:  100 OMNIPAQUE IOHEXOL 350 MG/ML SOLN  COMPARISON:  02/24/2007 CT chest  FINDINGS: CTA CHEST FINDINGS  Intact thoracic aorta. Minor atherosclerotic change of the ascending aorta and transverse aorta. Major branch vessels remain patent. Descending thoracic aorta is normal proximally. Distal descending thoracic aorta above the diaphragmatic hiatus demonstrates aneurysmal dilatation measuring 4.0 x 4.5 cm, image 83. Mural thrombus noted diffusely. Native lumen remains patent.  Small branching filling defect noted within a right lower lobe pulmonary segmental artery, images 61-65. This is compatible with a right lower lobe pulmonary embolus. Very minimal thrombus burden. No large proximal or central pulmonary embolus.  Negative for adenopathy. Normal heart size. No pericardial or pleural effusion. Distal esophagus demonstrates circumferential wall thickening and a small hernia. This can be seen with reflux esophagitis.  Lung windows demonstrate mixed centrilobular and paraseptal emphysema. Small apical blebs noted. Minor basilar atelectasis. No acute airspace process, collapse or consolidation. No suspicious pulmonary mass or nodule.  No acute osseous finding.  Review of the MIP images confirms the above findings.  CTA ABDOMEN AND PELVIS FINDINGS  Proximal abdominal aorta demonstrates similar aneurysmal dilatation which tapers to chronic appearing occlusion at the level of the right renal artery. Above the occlusion, the celiac, SMA, and right renal artery are patent. Left renal artery is not visualized compatible with conclusion. Infrarenal aorto to left femoral bypass conduit is chronically occluded. Left femoral to right femoral bypass is also occluded across the anterior pelvis. There is reconstitution of the lower extremity profunda femoral branches via the inferior  epigastric pathways bilaterally and also the superior hemorrhoidal to internal iliac pathways. the native common femoral and superficial femoral arteries are occluded bilaterally.  Review of the MIP images confirms the above findings.  Nonvascular findings:  Liver, gallbladder, biliary system, pancreas, and adrenal glands are within normal limits for age and arterial phase imaging. Spleen demonstrates scattered calcifications compatible with remote granulomatous disease. Right kidney demonstrates inferior cortical scarring but normal enhancement without obstruction. Left kidney demonstrates very poor profusion from chronic left renal artery occlusion and diffuse atrophy.  Negative for bowel obstruction, dilatation, ileus, free air, fluid collection, abscess, or hemorrhage.  Small amount of dependent pelvic fluid, nonspecific. No acute distal bowel process. Urinary bladder unremarkable.  Patient is status post prior below-knee amputations bilaterally. Fixation hardware noted of the left hip. No acute osseous finding of the thoracic or lumbar spine.  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  Chronic occlusion of the left to right femoral bypass  Preserved patency of the celiac, SMA, and right renal artery.  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  Small right lower lobe pulmonary embolus.   Electronically Signed   By: Ruel Favors M.D.   On: 12/29/2012 17:13    Microbiology: Recent Results (from the past 240 hour(s))  MRSA PCR SCREENING     Status: None   Collection Time    12/29/12  5:52 AM      Result Value Range Status   MRSA by PCR NEGATIVE  NEGATIVE Final    Comment:            The GeneXpert MRSA Assay (FDA     approved for NASAL specimens     only), is one component of a     comprehensive MRSA colonization     surveillance program. It is not     intended to diagnose MRSA     infection nor to guide or     monitor treatment for     MRSA infections.     Labs: Basic Metabolic Panel:  Recent Labs Lab 12/29/12 1023  NA 133*  K 3.8  CL 97  CO2 22  GLUCOSE 104*  BUN 26*  CREATININE 1.32  CALCIUM 9.7   Liver Function Tests: No results found for this basename: AST, ALT, ALKPHOS, BILITOT, PROT, ALBUMIN,  in the last 168 hours No results found for this basename: LIPASE, AMYLASE,  in the last 168 hours No results found for this basename: AMMONIA,  in the last 168 hours CBC:  Recent Labs Lab 12/29/12 1023  WBC 6.1  HGB 11.0*  HCT 33.3*  MCV 82.8  PLT 417*   Cardiac Enzymes: No results found for this basename: CKTOTAL, CKMB, CKMBINDEX, TROPONINI,  in the last 168 hours BNP: BNP (last 3 results) No results found for this basename: PROBNP,  in the last 8760 hours CBG: No results found for this basename: GLUCAP,  in the last 168 hours     Signed:  Jonette Mate N  [facility] 12/31/2012, 1:20 PM

## 2012-12-31 NOTE — H&P (View-Only) (Signed)
   VASCULAR PROGRESS NOTE  SUBJECTIVE: No significant change in pain in both BKA stumps. (L > R).  PHYSICAL EXAM: Filed Vitals:   12/29/12 1403 12/29/12 1700 12/29/12 2207 12/30/12 0549  BP: 95/65 114/68 144/66 99/61  Pulse: 80 73 84 85  Temp: 98.8 F (37.1 C)  98.4 F (36.9 C) 98.5 F (36.9 C)  TempSrc: Oral  Oral Oral  Resp: 18  20 20  Height:      Weight:      SpO2: 100%  100% 100%   Both stumps have good temperature and are not cool.   LABS: Lab Results  Component Value Date   WBC 6.1 12/29/2012   HGB 11.0* 12/29/2012   HCT 33.3* 12/29/2012   MCV 82.8 12/29/2012   PLT 417* 12/29/2012   Lab Results  Component Value Date   CREATININE 1.32 12/29/2012   CT SCAN: Occluded Thoracofemoral bypass. Bilat SFA occlusions. Small DFA patent bilat.   He tells me that he has had extensive cardiac w/u in [place] and in [place] and has a poor EF (20% ?)  CBG (last 3)   Principal Problem:   PAD (peripheral artery disease) Active Problems:   S/P BKA (below knee amputation) bilateral   CHF (congestive heart failure)   COPD (chronic obstructive pulmonary disease)   Opioid dependence   Chronic pain syndrome   Seizures  ASSESSMENT AND PLAN:  If his pain cannot be adequately controlled then I have explained that there are 2 options.  1. Axillobifemoral bypass. However, given that the Thoracofemoral bypass failed, I suspect that the issue is poor outflow. This combined with severe CHF and a low EF make me think that another bypass has little chance for long term success.   2. Bilat AKA's. He is very much opposed to this and is fearful of losing his mobility.  I have also had a long discussion with him about tobacco cessation. We discussed the effect of nicotine on vasospasm. I believe that if he were able to get off nicotine completely, that this would significantly help his pain.  From our standpoint, if the pain is tolerable and he wants to go home, we could follow  this as an outpt.  Will follow.   Chris Francella Barnett Beeper: [phone] 12/30/2012    

## 2012-12-31 NOTE — Progress Notes (Signed)
Pt. Has valuables at bedside that he refused to lock up.  Reviewed hospital policy with pt. And that we are not responsible for any missing items.  Pt. Going down for surgery and wanted to leave items in his room unattended. Vanice Sarah

## 2012-12-31 NOTE — Interval H&P Note (Signed)
History and Physical Interval Note:  12/31/2012 5:38 AM  [name]  has presented today for surgery, with the diagnosis of esrd  The various methods of treatment have been discussed with the patient and family. After consideration of risks, benefits and other options for treatment, the patient has consented to  Procedure(s): INSERTION VENA-CAVA FILTER (N/A) as a surgical intervention .  The patient's history has been reviewed, patient examined, no change in status, stable for surgery.  I have reviewed the patient's chart and labs.  Questions were answered to the patient's satisfaction.     Leith Hedlund S

## 2012-12-31 NOTE — Op Note (Signed)
   NAME: [name]   MRN: [id number] DOB: December 05, 1965    DATE OF OPERATION: 12/31/2012  PREOP DIAGNOSIS: pulmonary embolus and left lower extremity DVT with contraindication to anticoagulation  POSTOP DIAGNOSIS: same  PROCEDURE:  1. Ultrasound-guided access to the right common femoral vein 2. Inferior venacavogram 3. Placement of IVC filter (Cook Celect IVC Filter)  Surgeon: Edilia Bo  FINDINGS: the inferior vena cava is patent and was 20 mm at the site of implantation.   TECHNIQUE: The patient was brought to the operating room and sedated by anesthesia. The right groin was prepped and draped in the usual sterile fashion. After the skin was anesthetized, and under ultrasound guidance, the right common femoral vein was cannulated and a guidewire introduced into the inferior vena cava under fluoroscopic control. The dilator was then advanced over the wire and then the dilator removed and pressure held. The coaxial introducer sheath system had been assembled and had  been flushed with heparinized saline. This was advanced over the wire and positioned in the inferior vena cava below the renal veins. The wire was then removed. An inferior venacavogram was obtained by injecting half-strength contrast through the dilator. Position of the renal veins was identified. Once I had chosen the correct position for placement of the filter, the wire was reintroduced and then the coaxial introducer sheath advanced with a dilator until the top of the sheath was positioned just below the renal veins. The wire was then removed and the dilator removed. The filter was then advanced through the sheath until it was level with the tactile bump on the filter introducer. Incision was again confirmed and then the filter introducer was then pinned and the sheath retracted in order to deploy the filter. The red safety button was then released and then the release button was pushed to fully release the filter. The sheath was  then retracted and pressure held for hemostasis. No immediate complications were noted.  Waverly Ferrari, MD, FACS [facility]  DATE OF DICTATION:   12/31/2012

## 2012-12-31 NOTE — Anesthesia Procedure Notes (Signed)
Procedure Name: LMA Insertion Date/Time: 12/31/2012 10:02 AM Performed by: Alanda Amass A Pre-anesthesia Checklist: Patient identified, Emergency Drugs available, Suction available, Patient being monitored and Timeout performed Patient Re-evaluated:Patient Re-evaluated prior to inductionOxygen Delivery Method: Circle system utilized Preoxygenation: Pre-oxygenation with 100% oxygen Intubation Type: IV induction Ventilation: Mask ventilation without difficulty LMA Size: 4.0 Number of attempts: 2 Placement Confirmation: positive ETCO2 and breath sounds checked- equal and bilateral Tube secured with: Tape Dental Injury: Teeth and Oropharynx as per pre-operative assessment

## 2012-12-31 NOTE — Interval H&P Note (Signed)
History and Physical Interval Note:  12/31/2012 5:38 AM  [name]  has presented today for surgery, with the diagnosis of esrd  The various methods of treatment have been discussed with the patient and family. After consideration of risks, benefits and other options for treatment, the patient has consented to  Procedure(s): INSERTION VENA-CAVA FILTER (N/A) as a surgical intervention .  The patient's history has been reviewed, patient examined, no change in status, stable for surgery.  I have reviewed the patient's chart and labs.  Questions were answered to the patient's satisfaction.     [name]   

## 2012-12-31 NOTE — Progress Notes (Signed)
Discharge instructions explained to pt.  Pt. Verbalized understanding.  Prescriptions given to step daughter to fill at pharmacy and step daughter was able to get the oxycodone filled.  Pt. Has the rest of his prescriptions.  Pt. Being discharged to assisted living via non-emergent EMS. [name]

## 2012-12-31 NOTE — Progress Notes (Signed)
Clinical Child psychotherapist (CSW) received call from RN stating that patient is medically ready for D/C back to [facility] in [place], [place]. [facility] reported that they could accept patient back and CSW faxed D/C summary to facility. CSW prepared D/C packet and arranged for non-emergency EMS ([facility]) for transportation back to [facility]. Patient's family is aware of above and nursing is aware of above. Please reconsult if further social work needs arise. CSW signing off.   Jetta Lout, LCSWA Weekend CSW [phone]

## 2012-12-31 NOTE — Transfer of Care (Signed)
Immediate Anesthesia Transfer of Care Note  Patient: [name]  Procedure(s) Performed: Procedure(s): INSERTION VENA-CAVA FILTER Femoral approach (N/A)  Patient Location: PACU  Anesthesia Type:General  Level of Consciousness: awake  Airway & Oxygen Therapy: Patient Spontanous Breathing  Post-op Assessment: Report given to PACU RN and Post -op Vital signs reviewed and stable  Post vital signs: Reviewed and stable  Complications: No apparent anesthesia complications

## 2012-12-31 NOTE — H&P (View-Only) (Signed)
   VASCULAR ADDENDUM:  Radiology noted small PE in Right lower lobe. Therefore, bilat LE venous duplex scan was performed. This shows DVT in the Left CFV, FV, and Popliteal vein. He has a history of an esophageal bleed and has a contraindication to anticoagulation. Therefore, I have recommended placement of an IVC filter. I have discussed the indications for filter placement. I have also discussed the potential complications of the procedure including, but not limited to bleeding, IVC thrombosis, PE, filter migration, and risk of long term problems. Would favor leaving filter in permanently, given his long term risk for DVT/PE because of limited mobility and contraindication to anticoagulation. All of his questions were answered and he is agreeable to proceed with surgery in the AM.  Cari Caraway Beeper: [phone] 12/30/2012

## 2012-12-31 NOTE — Anesthesia Postprocedure Evaluation (Signed)
Anesthesia Post Note  Patient: [name]  Procedure(s) Performed: Procedure(s) (LRB): INSERTION VENA-CAVA FILTER Femoral approach (N/A)  Anesthesia type: General  Patient location: PACU  Post pain: Pain level controlled and Adequate analgesia  Post assessment: Post-op Vital signs reviewed, Patient's Cardiovascular Status Stable, Respiratory Function Stable, Patent Airway and Pain level controlled  Last Vitals:  Filed Vitals:   12/31/12 1041  BP: 135/87  Pulse: 87  Temp:   Resp: 16    Post vital signs: Reviewed and stable  Level of consciousness: awake, alert  and oriented  Complications: No apparent anesthesia complications

## 2012-12-31 NOTE — Progress Notes (Signed)
Called report to Gretta Began at [facility] in [place].  They are aware of his arrival and have been updated. Vanice Sarah

## 2012-12-31 NOTE — Anesthesia Preprocedure Evaluation (Addendum)
Anesthesia Evaluation  Patient identified by MRN, date of birth, ID band Patient awake    Reviewed: Allergy & Precautions, H&P , NPO status , Patient's Chart, lab work & pertinent test results  Airway Mallampati: II  Neck ROM: full    Dental   Pulmonary COPDCurrent Smoker, PE         Cardiovascular hypertension, + CAD, + Peripheral Vascular Disease, +CHF and DVT     Neuro/Psych Seizures -,  Depression    GI/Hepatic H/o esophageal bleed   Endo/Other    Renal/GU negative Renal ROSPatient denies any renal disease.     Musculoskeletal   Abdominal   Peds  Hematology   Anesthesia Other Findings   Reproductive/Obstetrics                        Anesthesia Physical Anesthesia Plan  ASA: III  Anesthesia Plan: General   Post-op Pain Management:    Induction: Intravenous  Airway Management Planned: LMA  Additional Equipment:   Intra-op Plan:   Post-operative Plan:   Informed Consent: I have reviewed the patients History and Physical, chart, labs and discussed the procedure including the risks, benefits and alternatives for the proposed anesthesia with the patient or authorized representative who has indicated his/her understanding and acceptance.     Plan Discussed with: CRNA, Anesthesiologist and Surgeon  Anesthesia Plan Comments:         Anesthesia Quick Evaluation

## 2012-12-31 NOTE — Preoperative (Signed)
Beta Blockers   Reason not to administer Beta Blockers:received coreg at [facility] 11/23

## 2013-01-01 ENCOUNTER — Telehealth: Payer: Self-pay | Admitting: Vascular Surgery

## 2013-01-01 NOTE — Telephone Encounter (Signed)
-----   Message -----  From: Chuck Hint, MD  Sent: 01/01/2013 7:27 AM  To: Melene Plan, RN, Erenest Blank, RN  Subject: list   [place] on [address] went home. No f/U needed. Thanks  CD    FYI- dpm

## 2013-01-02 ENCOUNTER — Encounter (HOSPITAL_COMMUNITY): Payer: Self-pay | Admitting: Vascular Surgery

## 2013-01-18 ENCOUNTER — Telehealth: Payer: Self-pay | Admitting: Vascular Surgery

## 2013-01-18 NOTE — Telephone Encounter (Addendum)
Message copied by [name] on Thu Jan 18, 2013 10:33 AM ------      Message from: [name]      Created: Wed Jan 17, 2013  3:28 PM      Regarding: Appt. with [facility]      Contact: [phone]       Pt. wants appt. with Dr. Edilia Bo to discuss possible bypass options.  He doesn't need any vascular studies.  He just had CTA chest, abdomen, and pelvis in November @ [facility].  Pt. has bilateral BKA.  He resides at [facility]; call above # and ask to speak to him.      ------ [name],  I have called 2 times to speak with [name]. I have been told both times that he cannot make his own appointments. The MD with [facility] must order an appointment with our office. [name] said that she would let him know, even though I expressed my wish to speak with the patient to notify him of my conversation with the unit nurse. I guess this is FYI at this point. I will document everything in EPIC as a telephone encounter.  Thanks, [name]=

## 2015-04-14 IMAGING — CT CT ANGIO CHEST
2 of 13 series · 15 of 47 positions shown · IV contrast (APPLIED)
Comparison: 02/24/2007 CT chest

CLINICAL DATA: Bilateral below-knee amputations, chronic peripheral
vascular disease, status post prior aorto femoral and femoral to
femoral bypass.

EXAM:
CT ANGIOGRAPHY CHEST, ABDOMEN AND PELVIS including lower extremity
runoff
TECHNIQUE: Multidetector CT imaging through the chest, abdomen, pelvis and
lower extremities was performed using the standard protocol during
bolus administration of intravenous contrast. Multiplanar
reconstructed images including MIPs were obtained and reviewed to
evaluate the vascular anatomy.
CONTRAST:  100 OMNIPAQUE IOHEXOL 350 MG/ML SOLN

[Series 5: angiorunoff 1.0 i30f 3 · axial · 0.66mm/px · z∈[+4,+993]mm · 14 of 1089 slices shown]
[im 50/1089  lung]
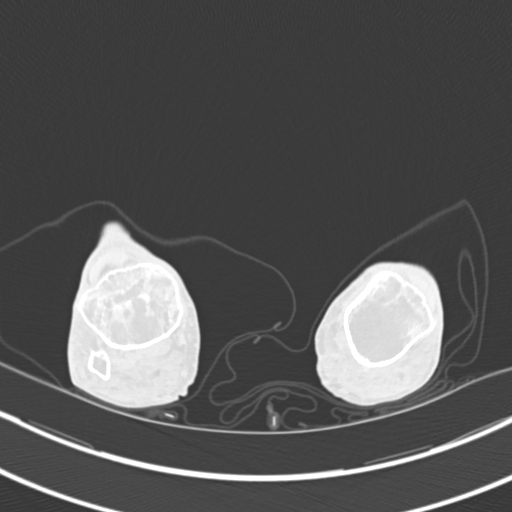
[im 149/1089  soft-tissue]
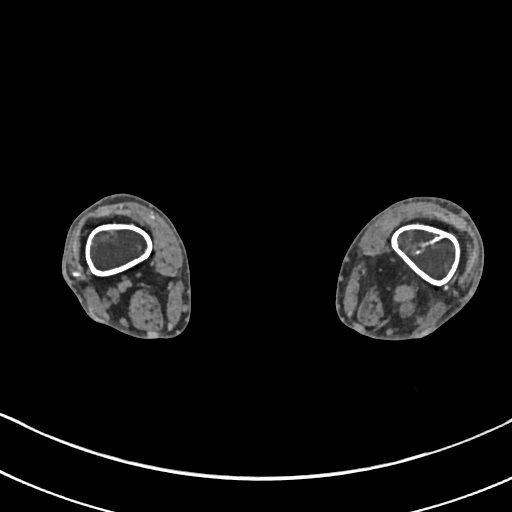
[im 198/1089  lung]
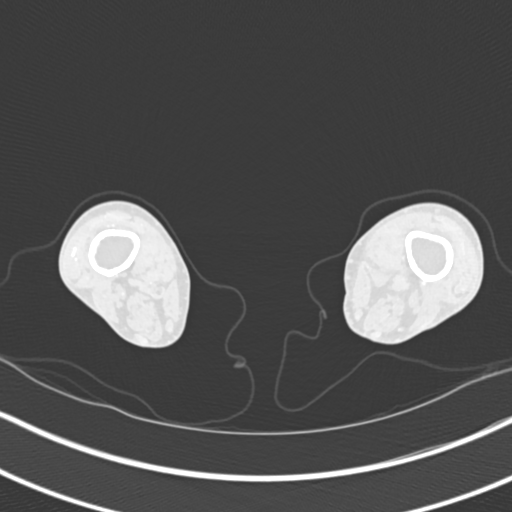
[im 297/1089  soft-tissue]
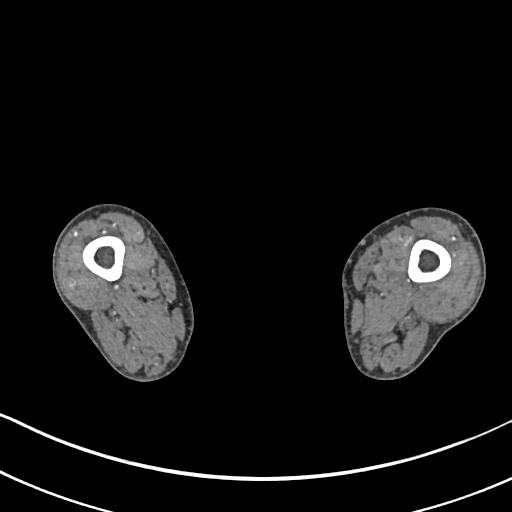
[im 347/1089  lung]
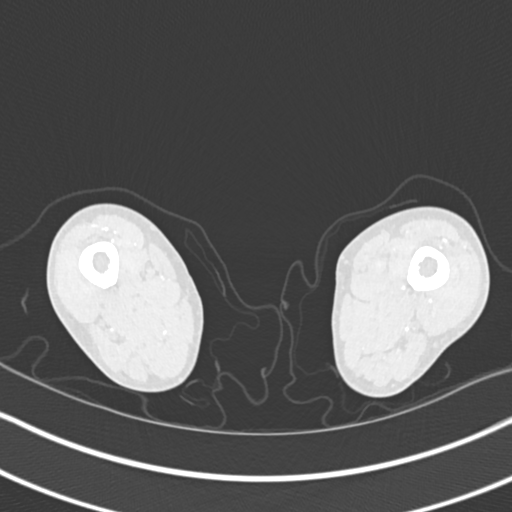
[im 446/1089  soft-tissue]
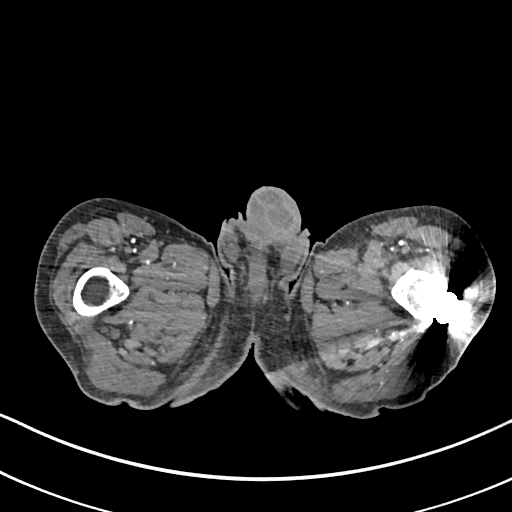
[im 495/1089  lung]
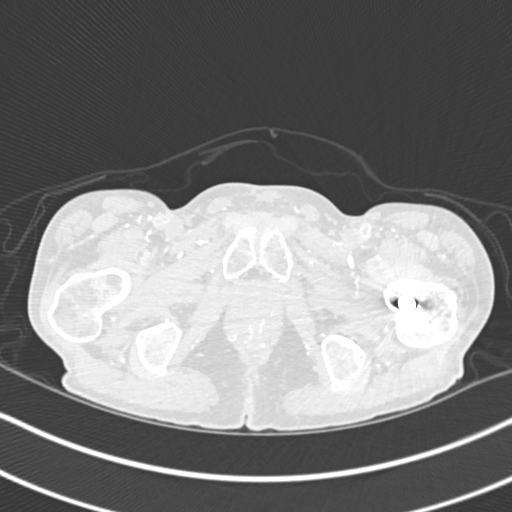
[im 594/1089  soft-tissue]
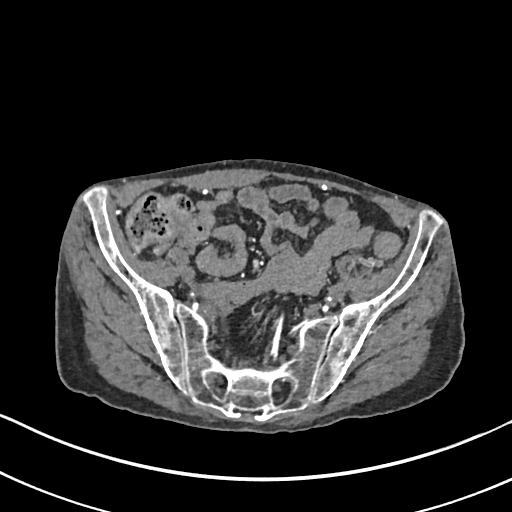
[im 643/1089  lung]
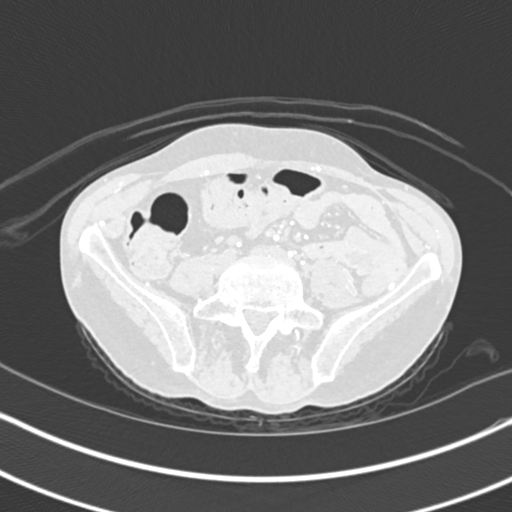
[im 742/1089  soft-tissue]
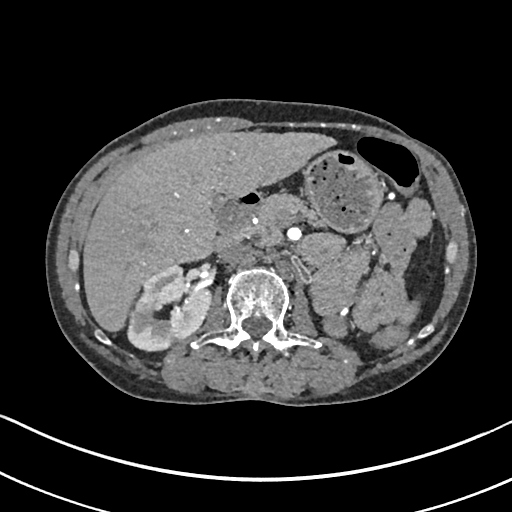
[im 792/1089  lung]
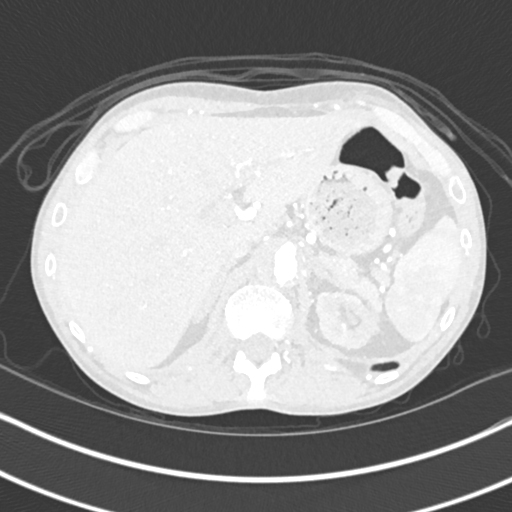
[im 891/1089  soft-tissue]
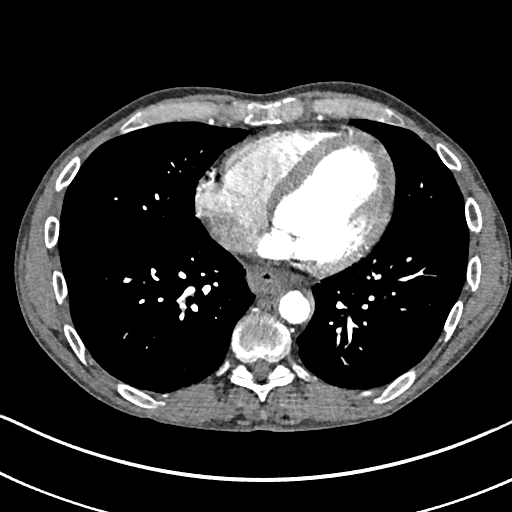
[im 940/1089  lung]
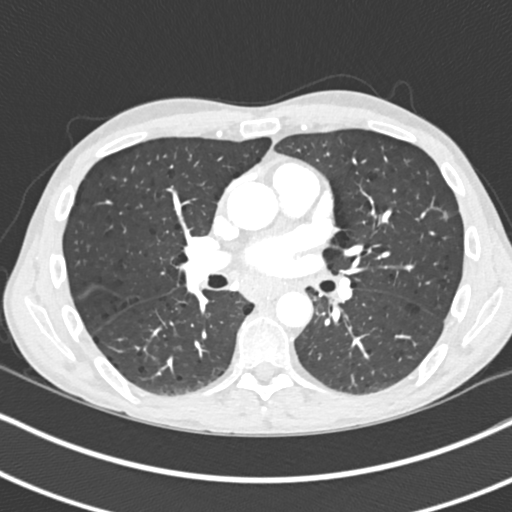
[im 1039/1089  soft-tissue]
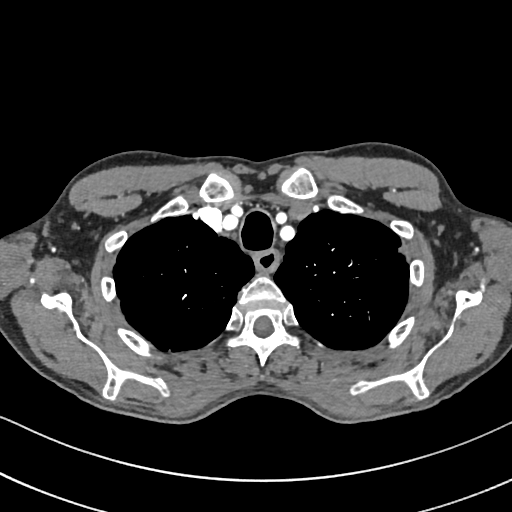

[Series 6: coronal upper · coronal · 0.44mm/px · 1 of 110 slices shown]
[im 55/110  soft-tissue]
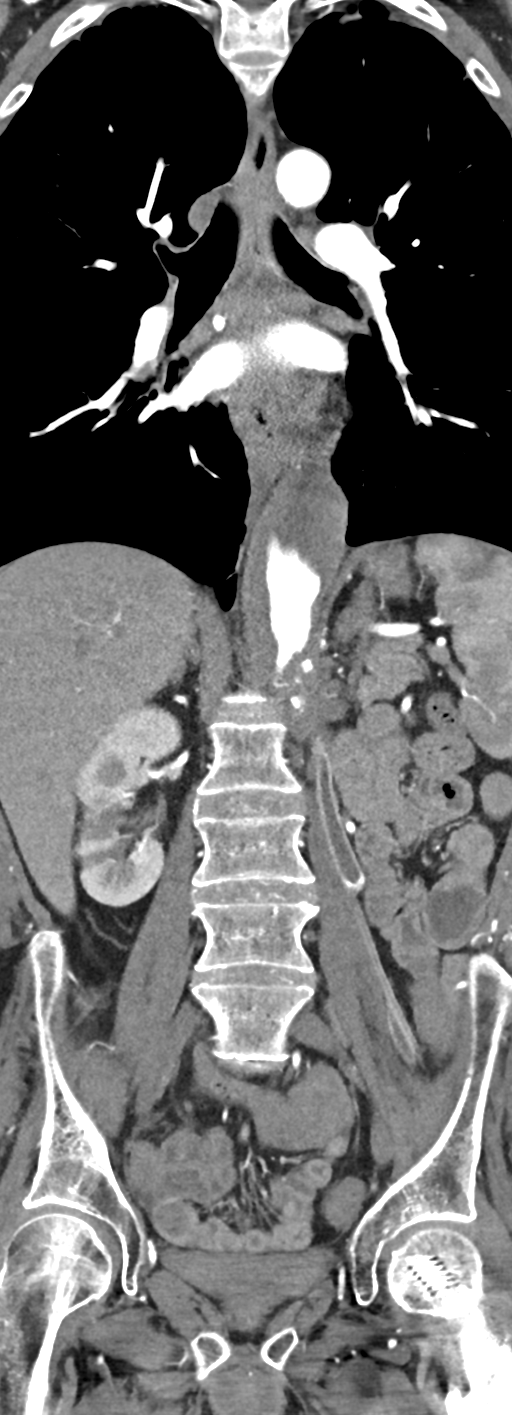

[15 of 47 positions shown; findings below may reference images not displayed]

FINDINGS: CTA CHEST FINDINGS

Intact thoracic aorta. Minor atherosclerotic change of the ascending
aorta and transverse aorta. Major branch vessels remain patent.
Descending thoracic aorta is normal proximally. Distal descending
thoracic aorta above the diaphragmatic hiatus demonstrates
aneurysmal dilatation measuring 4.0 x 4.5 cm, image 83. Mural
thrombus noted diffusely. Native lumen remains patent.

Small branching filling defect noted within a right lower lobe
pulmonary segmental artery, images 61-65. This is compatible with a
right lower lobe pulmonary embolus. Very minimal thrombus burden. No
large proximal or central pulmonary embolus.

Negative for adenopathy. Normal heart size. No pericardial or
pleural effusion. Distal esophagus demonstrates circumferential wall
thickening and a small hernia. This can be seen with reflux
esophagitis.

Lung windows demonstrate mixed centrilobular and paraseptal
emphysema. Small apical blebs noted. Minor basilar atelectasis. No
acute airspace process, collapse or consolidation. No suspicious
pulmonary mass or nodule.

No acute osseous finding.

Review of the MIP images confirms the above findings.

CTA ABDOMEN AND PELVIS FINDINGS

Proximal abdominal aorta demonstrates similar aneurysmal dilatation
which tapers to chronic appearing occlusion at the level of the
right renal artery. Above the occlusion, the celiac, SMA, and right
renal artery are patent. Left renal artery is not visualized
compatible with conclusion. Infrarenal aorto to left femoral bypass
conduit is chronically occluded. Left femoral to right femoral
bypass is also occluded across the anterior pelvis. There is
reconstitution of the lower extremity profunda femoral branches via
the inferior epigastric pathways bilaterally and also the superior
hemorrhoidal to internal iliac pathways. the native common femoral
and superficial femoral arteries are occluded bilaterally.

Review of the MIP images confirms the above findings.

Nonvascular findings:

Liver, gallbladder, biliary system, pancreas, and adrenal glands are
within normal limits for age and arterial phase imaging. Spleen
demonstrates scattered calcifications compatible with remote
granulomatous disease. Right kidney demonstrates inferior cortical
scarring but normal enhancement without obstruction. Left kidney
demonstrates very poor profusion from chronic left renal artery
occlusion and diffuse atrophy.

Negative for bowel obstruction, dilatation, ileus, free air, fluid
collection, abscess, or hemorrhage.

Small amount of dependent pelvic fluid, nonspecific. No acute distal
bowel process. Urinary bladder unremarkable.

Patient is status post prior below-knee amputations bilaterally.
Fixation hardware noted of the left hip. No acute osseous finding of
the thoracic or lumbar spine.
IMPRESSION: Thoracic aortic atherosclerosis with a distal descending thoracic
aortic aneurysm measuring 4.5 cm in greatest dimension with mural
thrombus.

Chronic occlusion of the infrarenal aortic to left femoral bypass.

Chronic occlusion of the left to right femoral bypass

Preserved patency of the celiac, SMA, and right renal artery.

Chronic occlusion of the left renal artery

Reconstitution of the profunda femoral branches into both lower
extremities via inferior epigastric pathways and superior
hemorrhoidal to internal iliac pathways.

Small right lower lobe pulmonary embolus.

## 2015-04-16 ENCOUNTER — Telehealth: Payer: Self-pay | Admitting: *Deleted

## 2015-04-16 NOTE — Telephone Encounter (Signed)
Patient of Dr. Adele Danickson's that was last seen 12-31-2012 for IVC filter placement; He is S/P Bilateral BKAs also in 2014. He is in the ED at [facility] in [place] now c/o extreme pain in his stump. The ED doctor, Dr. Althea Grimmerosado, just spoke to Dr. Edilia Boickson and they discussed patient's presenting S&S. After this discussion, Dr. Edilia Boickson told Dr. Althea Grimmerosado that he felt like the patient should call our office to make an appt for a re-evaluation of patient's chronic pain. Patient and Dr. Edilia Boickson were discussing this same pain issue and possible need for revisions to AKAs back in 2014.  Dr. Althea Grimmerosado was to determine if the patient's problems were acute enough to have patient transported to our [facility]([place] The decision was between the patient ,who is living in an assisted living facility, and Dr. Althea Grimmerosado.   About 5 minutes after the Drs spoke, Mr. [name] called me very upset that Dr. Edilia Boickson would not authorize transport from [place] to [place] via ambulance. I told him that both Drs had discussed his case. Patient stated" Dr. Deeann Dowseown there had only seen him 3 minutes and didn't exam him at all". I told him to go back in to the ED and tell them that he was not satisfied with his care. I told him that since we had not seen him since 12-31-2012 we would be happy to make him a re-evaluation office appt with Dr. Edilia Boickson as soon as we can but it would not be today. Patient became very verbally abusive to this nurse. I stressed to him that he needed to go back and talk to Dr. Althea Grimmerosado to make sure he was satisfied with this workup and examination this morning. I told him that if he can get transportation to our [facility] ED, he will be evaluated and that we always had a MD on call.   I made Dr. Edilia Boickson aware of this patient's phone call.

## 2015-05-08 ENCOUNTER — Telehealth: Payer: Self-pay

## 2015-05-08 NOTE — Telephone Encounter (Signed)
rec'd phone call from pt. with c/o cold sensation and increased pain of bilateral BKA stump areas.  Reported these symptoms have been "going on for months and almost a year."  Stated he has been trying to get by, because he didn't want to go through more surgery.  Is requesting an appt. with Dr. Edilia Boickson.  Reported that the cold temperature is present form the knees and down to the tip of the stump, in bilateral legs;denied discoloration of skin of stumps.  Reported at about 1" above the knee, the skin is warmer.  Stated he cannot wear his prosthesis, due to the discomfort.  Denied any open sores of Bil. BKA stumps.  Advised will have scheduler contact pt. with an appt.

## 2015-05-09 NOTE — Telephone Encounter (Signed)
Spoke with Marchelle FolksAmanda to schedule. dpm

## 2015-06-03 ENCOUNTER — Encounter: Payer: Self-pay | Admitting: Vascular Surgery

## 2015-06-06 ENCOUNTER — Ambulatory Visit: Payer: Medicare Other | Admitting: Vascular Surgery

## 2015-09-11 ENCOUNTER — Encounter: Payer: Self-pay | Admitting: Vascular Surgery

## 2015-09-18 ENCOUNTER — Ambulatory Visit: Payer: Medicare Other | Admitting: Vascular Surgery

## 2015-09-24 ENCOUNTER — Ambulatory Visit: Payer: Medicare Other | Admitting: Vascular Surgery

## 2015-10-22 ENCOUNTER — Ambulatory Visit: Payer: Medicare Other | Admitting: Vascular Surgery

## 2021-06-03 DIAGNOSIS — I7409 Other arterial embolism and thrombosis of abdominal aorta: Secondary | ICD-10-CM | POA: Diagnosis present

## 2022-03-04 ENCOUNTER — Ambulatory Visit (INDEPENDENT_AMBULATORY_CARE_PROVIDER_SITE_OTHER): Payer: Medicare Other | Admitting: Vascular Surgery

## 2022-03-04 ENCOUNTER — Encounter: Payer: Self-pay | Admitting: Vascular Surgery

## 2022-03-04 ENCOUNTER — Other Ambulatory Visit: Payer: Self-pay

## 2022-03-04 VITALS — BP 91/59 | HR 89 | Temp 98.7°F | Resp 20 | Ht 70.0 in | Wt 104.0 lb

## 2022-03-04 DIAGNOSIS — I739 Peripheral vascular disease, unspecified: Secondary | ICD-10-CM | POA: Diagnosis not present

## 2022-03-04 NOTE — H&P (View-Only) (Signed)
ASSESSMENT & PLAN   BILATERAL ISCHEMIC BELOW THE KNEE AMPUTATIONS: This patient has ischemic bilateral below the knee amputations.  He does not have any options for revascularization.  His only option would be bilateral above-the-knee amputations.  I explained that given his aortic occlusion he certainly would have some risk of not healing these also.  We have also discussed importance of tobacco cessation.  He would like to schedule this soon as possible given that he is having significant pain.  He is scheduled for bilateral above-the-knee amputations on 03/09/2022.  Given his multiple medical issues he will need to be admitted by medicine postoperatively.  REASON FOR CONSULT:    Ischemic bilateral below the knee amputations.  HPI:   [name] is a 57 y.o. male who was undergone previous bilateral below the knee amputations.  I only have records going back to 2014.  It sounds like at some point he had a thoracofemoral bypass graft which ultimately failed.  He was not a good candidate for an axillofemoral graft because of severe congestive heart failure and a low EF.  He was last seen in our office in 2014.  He was admitted in [place] with rhabdomyolysis and worsening renal failure.  This was in December of last year.  He was noted to have ischemic bilateral below the knee amputations with rest pain.  He was set up for follow-up with [place] to discuss possible bilateral AKA's.  The patient describes pain in both AKA stumps which she has had for some time but he has been reluctant to consider surgery.  He now has small wounds on both BKA stumps.  He does continue to smoke.  He smokes a pack per day of cigarettes and has been smoking for 30 years.  He has multiple medical comorbidities including COPD, history of congestive heart failure, and end-stage renal disease.  He is on dialysis on Monday Wednesdays and Fridays.  He has a functioning right IJ tunneled dialysis catheter.  Past  Medical History:  Diagnosis Date   Anemia    CHF (congestive heart failure) (HCC)    COPD (chronic obstructive pulmonary disease) (HCC)    Coronary artery disease    Depression    Hypertension    Peripheral vascular disease (HCC)    Seizures (HCC)     History reviewed. No pertinent family history.  SOCIAL HISTORY: Social History   Tobacco Use   Smoking status: Every Day    Packs/day: 1.00    Years: 30.00    Total pack years: 30.00    Types: Cigarettes   Smokeless tobacco: Current  Substance Use Topics   Alcohol use: No    Comment: hasn't in 9 months    No Known Allergies  Current Outpatient Medications  Medication Sig Dispense Refill   aspirin EC 81 MG tablet Take by mouth.     atorvastatin (LIPITOR) 40 MG tablet Take 1 tablet by mouth at bedtime.     budesonide-formoterol (SYMBICORT) 80-4.5 MCG/ACT inhaler Inhale 2 puffs into the lungs daily.     carvedilol (COREG) 3.125 MG tablet Take 3.125 mg by mouth 2 (two) times daily with a meal.     Cholecalciferol (VITAMIN D-3) 1000 UNITS CAPS Take 2,000 Units by mouth daily.     clopidogrel (PLAVIX) 75 MG tablet Take 75 mg by mouth daily.     gabapentin (NEURONTIN) 400 MG capsule Take 400 mg by mouth 3 (three) times daily.     HYDROcodone-acetaminophen (NORCO) 10-325 MG tablet  Take 1 tablet by mouth every 8 (eight) hours.     ipratropium-albuterol (DUONEB) 0.5-2.5 (3) MG/3ML SOLN Inhale into the lungs.     levETIRAcetam (KEPPRA) 500 MG tablet Take 1 tablet by mouth 2 (two) times daily.     levocetirizine (XYZAL) 5 MG tablet Take 5 mg by mouth every evening.     lisinopril (PRINIVIL,ZESTRIL) 2.5 MG tablet Take 2.5 mg by mouth daily.     Magnesium Hydroxide (MILK OF MAGNESIA PO) Take 30 mLs by mouth daily as needed (constipation).     meclizine (ANTIVERT) 25 MG tablet Take 25 mg by mouth 3 (three) times daily as needed.     megestrol (MEGACE) 400 MG/10ML suspension Take 400 mg by mouth daily.     MOVANTIK 25 MG TABS tablet Take  25 mg by mouth daily.     pantoprazole (PROTONIX) 40 MG tablet Take 40 mg by mouth daily.     phenytoin (DILANTIN) 100 MG ER capsule Take 200 mg by mouth 3 (three) times daily.     senna (SENOKOT) 8.6 MG TABS tablet Take 1 tablet (8.6 mg total) by mouth 2 (two) times daily. 120 each 0   tamsulosin (FLOMAX) 0.4 MG CAPS capsule Take 0.4 mg by mouth daily.     thiamine (VITAMIN B1) 100 MG tablet Take by mouth.     No current facility-administered medications for this visit.    REVIEW OF SYSTEMS:  [X]  denotes positive finding, [ ]  denotes negative finding Cardiac  Comments:  Chest pain or chest pressure:    Shortness of breath upon exertion:    Short of breath when lying flat:    Irregular heart rhythm:        Vascular    Pain in calf, thigh, or hip brought on by ambulation:    Pain in feet at night that wakes you up from your sleep:     Blood clot in your veins:    Leg swelling:  x BKAs      Pulmonary    Oxygen at home:    Productive cough:     Wheezing:         Neurologic    Sudden weakness in arms or legs:     Sudden numbness in arms or legs:     Sudden onset of difficulty speaking or slurred speech:    Temporary loss of vision in one eye:     Problems with dizziness:         Gastrointestinal    Blood in stool:     Vomited blood:         Genitourinary    Burning when urinating:     Blood in urine:        Psychiatric    Major depression:         Hematologic    Bleeding problems:    Problems with blood clotting too easily:        Skin    Rashes or ulcers:        Constitutional    Fever or chills:    -  PHYSICAL EXAM:   Vitals:   03/04/22 1245  BP: (!) 91/59  Pulse: 89  Resp: 20  Temp: 98.7 F (37.1 C)  SpO2: 97%  Weight: 104 lb (47.2 kg)  Height: 5\' 10"  (1.778 m)   Body mass index is 14.92 kg/m. GENERAL: The patient is a well-nourished male, in no acute distress. The vital signs are documented above. CARDIAC: There is a  regular rate and rhythm.   VASCULAR: I do not detect carotid bruits. I cannot palpate femoral pulses. Both BKA's are somewhat cool and slightly mottled.  He has small wounds on both BKA's.  PULMONARY: There is good air exchange bilaterally without wheezing or rales. ABDOMEN: Soft and non-tender with normal pitched bowel sounds.  MUSCULOSKELETAL: There are no major deformities. NEUROLOGIC: No focal weakness or paresthesias are detected. SKIN: There are no ulcers or rashes noted. PSYCHIATRIC: The patient has a normal affect.  DATA:    I reviewed his previous Doppler study that was done elsewhere.  This showed evidence of bilateral superficial femoral artery occlusions.  He has a known aortic occlusion.    Deitra Mayo [facility]

## 2022-03-04 NOTE — Progress Notes (Signed)
 ASSESSMENT & PLAN   BILATERAL ISCHEMIC BELOW THE KNEE AMPUTATIONS: This patient has ischemic bilateral below the knee amputations.  He does not have any options for revascularization.  His only option would be bilateral above-the-knee amputations.  I explained that given his aortic occlusion he certainly would have some risk of not healing these also.  We have also discussed importance of tobacco cessation.  He would like to schedule this soon as possible given that he is having significant pain.  He is scheduled for bilateral above-the-knee amputations on 03/09/2022.  Given his multiple medical issues he will need to be admitted by medicine postoperatively.  REASON FOR CONSULT:    Ischemic bilateral below the knee amputations.  HPI:   [name] is a 57 y.o. male who was undergone previous bilateral below the knee amputations.  I only have records going back to 2014.  It sounds like at some point he had a thoracofemoral bypass graft which ultimately failed.  He was not a good candidate for an axillofemoral graft because of severe congestive heart failure and a low EF.  He was last seen in our office in 2014.  He was admitted in [place] with rhabdomyolysis and worsening renal failure.  This was in December of last year.  He was noted to have ischemic bilateral below the knee amputations with rest pain.  He was set up for follow-up with us to discuss possible bilateral AKA's.  The patient describes pain in both AKA stumps which she has had for some time but he has been reluctant to consider surgery.  He now has small wounds on both BKA stumps.  He does continue to smoke.  He smokes a pack per day of cigarettes and has been smoking for 30 years.  He has multiple medical comorbidities including COPD, history of congestive heart failure, and end-stage renal disease.  He is on dialysis on Monday Wednesdays and Fridays.  He has a functioning right IJ tunneled dialysis catheter.  Past  Medical History:  Diagnosis Date   Anemia    CHF (congestive heart failure) (HCC)    COPD (chronic obstructive pulmonary disease) (HCC)    Coronary artery disease    Depression    Hypertension    Peripheral vascular disease (HCC)    Seizures (HCC)     History reviewed. No pertinent family history.  SOCIAL HISTORY: Social History   Tobacco Use   Smoking status: Every Day    Packs/day: 1.00    Years: 30.00    Total pack years: 30.00    Types: Cigarettes   Smokeless tobacco: Current  Substance Use Topics   Alcohol use: No    Comment: hasn't in 9 months    No Known Allergies  Current Outpatient Medications  Medication Sig Dispense Refill   aspirin EC 81 MG tablet Take by mouth.     atorvastatin (LIPITOR) 40 MG tablet Take 1 tablet by mouth at bedtime.     budesonide-formoterol (SYMBICORT) 80-4.5 MCG/ACT inhaler Inhale 2 puffs into the lungs daily.     carvedilol (COREG) 3.125 MG tablet Take 3.125 mg by mouth 2 (two) times daily with a meal.     Cholecalciferol (VITAMIN D-3) 1000 UNITS CAPS Take 2,000 Units by mouth daily.     clopidogrel (PLAVIX) 75 MG tablet Take 75 mg by mouth daily.     gabapentin (NEURONTIN) 400 MG capsule Take 400 mg by mouth 3 (three) times daily.     HYDROcodone-acetaminophen (NORCO) 10-325 MG tablet   Take 1 tablet by mouth every 8 (eight) hours.     ipratropium-albuterol (DUONEB) 0.5-2.5 (3) MG/3ML SOLN Inhale into the lungs.     levETIRAcetam (KEPPRA) 500 MG tablet Take 1 tablet by mouth 2 (two) times daily.     levocetirizine (XYZAL) 5 MG tablet Take 5 mg by mouth every evening.     lisinopril (PRINIVIL,ZESTRIL) 2.5 MG tablet Take 2.5 mg by mouth daily.     Magnesium Hydroxide (MILK OF MAGNESIA PO) Take 30 mLs by mouth daily as needed (constipation).     meclizine (ANTIVERT) 25 MG tablet Take 25 mg by mouth 3 (three) times daily as needed.     megestrol (MEGACE) 400 MG/10ML suspension Take 400 mg by mouth daily.     MOVANTIK 25 MG TABS tablet Take  25 mg by mouth daily.     pantoprazole (PROTONIX) 40 MG tablet Take 40 mg by mouth daily.     phenytoin (DILANTIN) 100 MG ER capsule Take 200 mg by mouth 3 (three) times daily.     senna (SENOKOT) 8.6 MG TABS tablet Take 1 tablet (8.6 mg total) by mouth 2 (two) times daily. 120 each 0   tamsulosin (FLOMAX) 0.4 MG CAPS capsule Take 0.4 mg by mouth daily.     thiamine (VITAMIN B1) 100 MG tablet Take by mouth.     No current facility-administered medications for this visit.    REVIEW OF SYSTEMS:  [X]  denotes positive finding, [ ]  denotes negative finding Cardiac  Comments:  Chest pain or chest pressure:    Shortness of breath upon exertion:    Short of breath when lying flat:    Irregular heart rhythm:        Vascular    Pain in calf, thigh, or hip brought on by ambulation:    Pain in feet at night that wakes you up from your sleep:     Blood clot in your veins:    Leg swelling:  x BKAs      Pulmonary    Oxygen at home:    Productive cough:     Wheezing:         Neurologic    Sudden weakness in arms or legs:     Sudden numbness in arms or legs:     Sudden onset of difficulty speaking or slurred speech:    Temporary loss of vision in one eye:     Problems with dizziness:         Gastrointestinal    Blood in stool:     Vomited blood:         Genitourinary    Burning when urinating:     Blood in urine:        Psychiatric    Major depression:         Hematologic    Bleeding problems:    Problems with blood clotting too easily:        Skin    Rashes or ulcers:        Constitutional    Fever or chills:    -  PHYSICAL EXAM:   Vitals:   03/04/22 1245  BP: (!) 91/59  Pulse: 89  Resp: 20  Temp: 98.7 F (37.1 C)  SpO2: 97%  Weight: 104 lb (47.2 kg)  Height: 5\' 10"  (1.778 m)   Body mass index is 14.92 kg/m. GENERAL: The patient is a well-nourished male, in no acute distress. The vital signs are documented above. CARDIAC: There is a  regular rate and rhythm.   VASCULAR: I do not detect carotid bruits. I cannot palpate femoral pulses. Both BKA's are somewhat cool and slightly mottled.  He has small wounds on both BKA's.  PULMONARY: There is good air exchange bilaterally without wheezing or rales. ABDOMEN: Soft and non-tender with normal pitched bowel sounds.  MUSCULOSKELETAL: There are no major deformities. NEUROLOGIC: No focal weakness or paresthesias are detected. SKIN: There are no ulcers or rashes noted. PSYCHIATRIC: The patient has a normal affect.  DATA:    I reviewed his previous Doppler study that was done elsewhere.  This showed evidence of bilateral superficial femoral artery occlusions.  He has a known aortic occlusion.    Deitra Mayo [facility]

## 2022-03-05 ENCOUNTER — Other Ambulatory Visit: Payer: Self-pay

## 2022-03-08 ENCOUNTER — Telehealth: Payer: Self-pay

## 2022-03-08 ENCOUNTER — Inpatient Hospital Stay (HOSPITAL_COMMUNITY): Admission: RE | Admit: 2022-03-08 | Payer: Medicare Other | Source: Ambulatory Visit | Admitting: Vascular Surgery

## 2022-03-08 SURGERY — AMPUTATION, ABOVE KNEE
Anesthesia: General | Site: Knee | Laterality: Bilateral

## 2022-03-08 NOTE — Telephone Encounter (Signed)
Patient's wife contacted office to advise that patient was contacted on 03/05/22 and informed that his bilateral AKA's surgery would need to be cancelled for today due to questionable need to follow up cardiology.   I spoke with Karoline Caldwell, PA-C regarding the above. After further research and discussion with Dr. Gifford Shave, Jeneen Rinks advised if surgery needs to happen, it is OK to put back on the schedule with no additional   Informed patient's wife of this information. She stated patient does not want to wait until Dr. Nicole Cella next available on 03/22/22 because of the amount of pain he is in and wishes to be rescheduled as soon as possible. Surgery scheduled with Dr. Donzetta Matters on 03/12/22. Instructions reviewed. Aware patient should continue ASA and Plavix per Dr. Scot Dock. She verbalized understanding and appreciation.

## 2022-03-09 NOTE — Addendum Note (Signed)
Addended by: Nicholas Lose on: 03/09/2022 09:51 AM   Modules accepted: Orders

## 2022-03-10 NOTE — Anesthesia Preprocedure Evaluation (Addendum)
Anesthesia Evaluation  Patient identified by MRN, date of birth, ID band Patient awake    Reviewed: Allergy & Precautions, NPO status , Patient's Chart, lab work & pertinent test results  Airway Mallampati: I  TM Distance: >3 FB Neck ROM: Full    Dental no notable dental hx. (+) Edentulous Upper, Edentulous Lower   Pulmonary COPD, Current Smoker and Patient abstained from smoking.   Pulmonary exam normal breath sounds clear to auscultation       Cardiovascular hypertension, + CAD, + Peripheral Vascular Disease and +CHF  Normal cardiovascular exam Rhythm:Regular Rate:Normal     Neuro/Psych CVA    GI/Hepatic ,GERD  Medicated and Controlled,,  Endo/Other    Renal/GU ESRF and DialysisRenal diseaseMWF Lab Results      Component                Value               Date                      CREATININE               1.44 (H)            03/12/2022                BUN                      22 (H)              03/12/2022                NA                       138                 03/12/2022                K                        4.5                 03/12/2022                CL                       103                 03/12/2022                CO2                      21 (L)              03/12/2022                Musculoskeletal negative musculoskeletal ROS (+)    Abdominal   Peds  Hematology  (+) Blood dyscrasia, anemia Lab Results      Component                Value               Date                      WBC  5.8                 03/12/2022                HGB                      9.0 (L)             03/12/2022                HCT                      31.1 (L)            03/12/2022                MCV                      101.3 (H)           03/12/2022                PLT                      475 (H)             03/12/2022              Anesthesia Other Findings   Reproductive/Obstetrics                              Anesthesia Physical Anesthesia Plan  ASA: 4  Anesthesia Plan: General   Post-op Pain Management: Ketamine IV*, Tylenol PO (pre-op)* and Minimal or no pain anticipated   Induction: Intravenous  PONV Risk Score and Plan: 2 and Treatment may vary due to age or medical condition, Ondansetron, Midazolam and Dexamethasone  Airway Management Planned: Oral ETT  Additional Equipment: Arterial line and None  Intra-op Plan:   Post-operative Plan: Extubation in OR  Informed Consent: I have reviewed the patients History and Physical, chart, labs and discussed the procedure including the risks, benefits and alternatives for the proposed anesthesia with the patient or authorized representative who has indicated his/her understanding and acceptance.     Dental advisory given  Plan Discussed with: CRNA, Surgeon and Anesthesiologist  Anesthesia Plan Comments: (PAT note by Karoline Caldwell, PA-C: Patient is a 57 year old male with past medical history significant for HTN, PVD s/p bilateral BKA 2004, HFpEF, hepatic steatosis, COPD, seizure disorder s/p TBI, CVA, neuropathy, and current smoker.  Recent admission at [facility] 01/19/2022 through 01/31/2022 for AKI secondary to rhabdo versus oliguric with progression to ESRD on HD during admission.  He was felt to possibly have nontraumatic rhabdomyolysis secondary to ischemia of the legs.  He has severe chronic ischemia secondary to occluded bilateral aortofemoral bypass graft.  Kidney function was previously known to be normal.  Permacath placement was done during admission.  He was also noted to have acute exacerbation of COPD and community-acquired pneumonia and briefly required intubation for respiratory distress.  He was treated with antibiotics and DuoNebs and was on room air at time of charge.  2D echo was done during admission, I do not have access to these results, however it is documented  that his EF was normal.  Post discharge he did follow-up with his PCP Dr. Laverle Hobby on 02/23/2022.  Per note, "Patient is a 57 year old male.  He is in  chair.  He has PAD.  Bilateral BKA for PAD.  He had rhabdomyolysis and had the AKI, needed the hemodialysis.  He is alert and O x 3.  He has no chest pains.  He has weakness.  Pallor.  He see nephrology.  He has no N/V/D.  No F/C/S.  No abdominal pains.  He is going to see the vascular surgery in [facility] on March 05, 2022.  He is still smoking.  He has a lot of pain in the legs and back.  His pain level is 8 on 10 scale.  He took 3 pain medications today."  Patient had previous admission in March 2021 for chest pain.  Per discharge summary 04/13/2019, "In the ER, initial troponin noted to be undetectable at <0.015. EKG shows sinus rhythm with LVH. There are no acute ST-T wave abnormalities suggestive of ischemia. Initial blood pressure elevated at 198/120. Patient has received his home medications, and blood pressure had improved. CMP unremarkable, magnesium unremarkable. Lipid panel abnormal showing total cholesterol of 329, HDL 35, LDL 229, triglycerides 26. UDS positive for THC and opioids. Chest pain center unit course: Patient's troponins were trended and remained undetectable, therefore ACS ruled out. Patient's potassium dropped to 3.2, therefore delaying stress test. Potassium replaced and repeat level was 3.5. Hypokalemia was resolved. 2D echocardiogram showed EF greater than 50-55% with no wall motion abnormalities. The nuclear stress test showed an EF of 52% and no reversible ischemia noted. Patient's chest pain may have been due to hypertensive urgency. "  Per Dr. Nicole Cella note 03/04/2022, planning for postoperative admission to medicine service given his significant medical comorbidities.  ESRD on HD Monday Wednesday Friday via right IJ TDC.  Reviewed patient history with anesthesiologist Dr. Gifford Shave.  He advised okay to proceed as planned  barring acute status change.  Most recent hemoglobin from dialysis center on 03/03/2022 was 7.3.  Patient will need day of surgery labs and evaluation.  )        Anesthesia Quick Evaluation

## 2022-03-10 NOTE — Progress Notes (Signed)
Anesthesia Chart Review: Same day workup  Patient is a 57 year old male with past medical history significant for HTN, PVD s/p bilateral BKA 2004, HFpEF, hepatic steatosis, COPD, seizure disorder s/p TBI, CVA, neuropathy, and current smoker.  Recent admission at [facility] 01/19/2022 through 01/31/2022 for AKI secondary to rhabdo versus oliguric with progression to ESRD on HD during admission.  He was felt to possibly have nontraumatic rhabdomyolysis secondary to ischemia of the legs.  He has severe chronic ischemia secondary to occluded bilateral aortofemoral bypass graft.  Kidney function was previously known to be normal.  Permacath placement was done during admission.  He was also noted to have acute exacerbation of COPD and community-acquired pneumonia and briefly required intubation for respiratory distress.  He was treated with antibiotics and DuoNebs and was on room air at time of charge.  2D echo was done during admission, I do not have access to these results, however it is documented that his EF was normal.  Post discharge he did follow-up with his PCP Dr. Laverle Hobby on 02/23/2022.  Per note, "Patient is a 57 year old male.  He is in chair.  He has PAD.  Bilateral BKA for PAD.  He had rhabdomyolysis and had the AKI, needed the hemodialysis.  He is alert and O x 3.  He has no chest pains.  He has weakness.  Pallor.  He see nephrology.  He has no N/V/D.  No F/C/S.  No abdominal pains.  He is going to see the vascular surgery in [facility] on March 05, 2022.  He is still smoking.  He has a lot of pain in the legs and back.  His pain level is 8 on 10 scale.  He took 3 pain medications today."  Patient had previous admission in March 2021 for chest pain.  Per discharge summary 04/13/2019, "In the ER, initial troponin noted to be undetectable at <0.015. EKG shows sinus rhythm with LVH. There are no acute ST-T wave abnormalities suggestive of ischemia. Initial blood pressure elevated at  198/120. Patient has received his home medications, and blood pressure had improved. CMP unremarkable, magnesium unremarkable. Lipid panel abnormal showing total cholesterol of 329, HDL 35, LDL 229, triglycerides 26. UDS positive for THC and opioids. Chest pain center unit course: Patient's troponins were trended and remained undetectable, therefore ACS ruled out. Patient's potassium dropped to 3.2, therefore delaying stress test. Potassium replaced and repeat level was 3.5. Hypokalemia was resolved. 2D echocardiogram showed EF greater than 50-55% with no wall motion abnormalities. The nuclear stress test showed an EF of 52% and no reversible ischemia noted. Patient's chest pain may have been due to hypertensive urgency. "  Per Dr. Nicole Cella note 03/04/2022, planning for postoperative admission to medicine service given his significant medical comorbidities.  ESRD on HD Monday Wednesday Friday via right IJ TDC.  Reviewed patient history with anesthesiologist Dr. Gifford Shave.  He advised okay to proceed as planned barring acute status change.  Most recent hemoglobin from dialysis center on 03/03/2022 was 7.3.  Patient will need day of surgery labs and evaluation.   Wynonia Musty [facility] Short Stay Center/Anesthesiology Phone [phone] 03/10/2022 4:51 PM

## 2022-03-11 ENCOUNTER — Encounter (HOSPITAL_COMMUNITY): Payer: Self-pay | Admitting: Vascular Surgery

## 2022-03-11 ENCOUNTER — Other Ambulatory Visit: Payer: Self-pay

## 2022-03-11 NOTE — Progress Notes (Addendum)
Spoke with friend Leslie Dales cell [phone] for PAT information and instructions for DOS.  PCP - Dr Laverle Hobby Cardiologist - n/a Nephrology - Dr Drusilla Kanner  Chest x-ray - 02/02/22 CE EKG - DOS Stress Test - 04/2019 ECHO - 04/12/19 Cardiac Cath - n/a  ICD Pacemaker/Loop - n/a  Sleep Study -  n/a CPAP - none  ASA/Blood Thinner Instructions:  Per Dr. Nicole Cella  instructions, continue ASA and Plavix.    Anesthesia review: Yes  STOP now taking any Aspirin (unless otherwise instructed by your surgeon), Aleve, Naproxen, Ibuprofen, Motrin, Advil, Goody's, BC's, all herbal medications, fish oil, and all vitamins.   Coronavirus Screening Does the patient have any of the following symptoms:  Cough yes/no: No Fever (>100.41F)  yes/no: No Runny nose yes/no: No Sore throat yes/no: No Difficulty breathing/shortness of breath  yes/no: No  Has the patient traveled in the last 14 days and where? yes/no: No  Friend Charlene verbalized understanding of instructions that were given via phone.

## 2022-03-12 ENCOUNTER — Inpatient Hospital Stay (HOSPITAL_COMMUNITY): Payer: Medicare Other | Admitting: Physician Assistant

## 2022-03-12 ENCOUNTER — Encounter (HOSPITAL_COMMUNITY): Payer: Self-pay | Admitting: Vascular Surgery

## 2022-03-12 ENCOUNTER — Other Ambulatory Visit: Payer: Self-pay

## 2022-03-12 ENCOUNTER — Encounter (HOSPITAL_COMMUNITY)
Admission: RE | Disposition: A | Discharge status: Home Health | Payer: Self-pay | Source: Home / Self Care | Attending: Internal Medicine

## 2022-03-12 ENCOUNTER — Inpatient Hospital Stay (HOSPITAL_COMMUNITY)
Admission: RE | Admit: 2022-03-12 | Discharge: 2022-03-16 | DRG: 239 | Disposition: A | Discharge status: Home Health | Payer: Medicare Other | Attending: Internal Medicine | Admitting: Internal Medicine

## 2022-03-12 DIAGNOSIS — R569 Unspecified convulsions: Secondary | ICD-10-CM | POA: Diagnosis not present

## 2022-03-12 DIAGNOSIS — F17210 Nicotine dependence, cigarettes, uncomplicated: Secondary | ICD-10-CM | POA: Diagnosis present

## 2022-03-12 DIAGNOSIS — D62 Acute posthemorrhagic anemia: Secondary | ICD-10-CM | POA: Diagnosis not present

## 2022-03-12 DIAGNOSIS — L97819 Non-pressure chronic ulcer of other part of right lower leg with unspecified severity: Secondary | ICD-10-CM | POA: Diagnosis present

## 2022-03-12 DIAGNOSIS — T8789 Other complications of amputation stump: Secondary | ICD-10-CM

## 2022-03-12 DIAGNOSIS — G629 Polyneuropathy, unspecified: Secondary | ICD-10-CM

## 2022-03-12 DIAGNOSIS — K219 Gastro-esophageal reflux disease without esophagitis: Secondary | ICD-10-CM | POA: Diagnosis present

## 2022-03-12 DIAGNOSIS — Z681 Body mass index (BMI) 19 or less, adult: Secondary | ICD-10-CM

## 2022-03-12 DIAGNOSIS — G894 Chronic pain syndrome: Secondary | ICD-10-CM | POA: Diagnosis present

## 2022-03-12 DIAGNOSIS — Z89512 Acquired absence of left leg below knee: Secondary | ICD-10-CM

## 2022-03-12 DIAGNOSIS — G40909 Epilepsy, unspecified, not intractable, without status epilepticus: Secondary | ICD-10-CM | POA: Diagnosis present

## 2022-03-12 DIAGNOSIS — N186 End stage renal disease: Secondary | ICD-10-CM | POA: Diagnosis not present

## 2022-03-12 DIAGNOSIS — Z7982 Long term (current) use of aspirin: Secondary | ICD-10-CM

## 2022-03-12 DIAGNOSIS — I70463 Atherosclerosis of autologous vein bypass graft(s) of the extremities with gangrene, bilateral legs: Secondary | ICD-10-CM | POA: Diagnosis present

## 2022-03-12 DIAGNOSIS — F32A Depression, unspecified: Secondary | ICD-10-CM | POA: Diagnosis present

## 2022-03-12 DIAGNOSIS — E876 Hypokalemia: Secondary | ICD-10-CM | POA: Diagnosis not present

## 2022-03-12 DIAGNOSIS — Z89511 Acquired absence of right leg below knee: Secondary | ICD-10-CM | POA: Diagnosis not present

## 2022-03-12 DIAGNOSIS — Z8673 Personal history of transient ischemic attack (TIA), and cerebral infarction without residual deficits: Secondary | ICD-10-CM | POA: Diagnosis not present

## 2022-03-12 DIAGNOSIS — Z993 Dependence on wheelchair: Secondary | ICD-10-CM

## 2022-03-12 DIAGNOSIS — N179 Acute kidney failure, unspecified: Secondary | ICD-10-CM | POA: Diagnosis not present

## 2022-03-12 DIAGNOSIS — E46 Unspecified protein-calorie malnutrition: Secondary | ICD-10-CM | POA: Diagnosis present

## 2022-03-12 DIAGNOSIS — I509 Heart failure, unspecified: Secondary | ICD-10-CM | POA: Diagnosis not present

## 2022-03-12 DIAGNOSIS — J449 Chronic obstructive pulmonary disease, unspecified: Secondary | ICD-10-CM | POA: Diagnosis present

## 2022-03-12 DIAGNOSIS — I7409 Other arterial embolism and thrombosis of abdominal aorta: Secondary | ICD-10-CM | POA: Diagnosis not present

## 2022-03-12 DIAGNOSIS — Z992 Dependence on renal dialysis: Secondary | ICD-10-CM | POA: Diagnosis not present

## 2022-03-12 DIAGNOSIS — I739 Peripheral vascular disease, unspecified: Secondary | ICD-10-CM | POA: Diagnosis present

## 2022-03-12 DIAGNOSIS — I998 Other disorder of circulatory system: Secondary | ICD-10-CM | POA: Diagnosis present

## 2022-03-12 DIAGNOSIS — N250 Renal osteodystrophy: Secondary | ICD-10-CM | POA: Diagnosis present

## 2022-03-12 DIAGNOSIS — Z89612 Acquired absence of left leg above knee: Secondary | ICD-10-CM | POA: Diagnosis not present

## 2022-03-12 DIAGNOSIS — R791 Abnormal coagulation profile: Secondary | ICD-10-CM | POA: Diagnosis present

## 2022-03-12 DIAGNOSIS — D509 Iron deficiency anemia, unspecified: Secondary | ICD-10-CM | POA: Diagnosis not present

## 2022-03-12 DIAGNOSIS — F1120 Opioid dependence, uncomplicated: Secondary | ICD-10-CM | POA: Diagnosis present

## 2022-03-12 DIAGNOSIS — Z7902 Long term (current) use of antithrombotics/antiplatelets: Secondary | ICD-10-CM

## 2022-03-12 DIAGNOSIS — I132 Hypertensive heart and chronic kidney disease with heart failure and with stage 5 chronic kidney disease, or end stage renal disease: Secondary | ICD-10-CM | POA: Diagnosis present

## 2022-03-12 DIAGNOSIS — L97829 Non-pressure chronic ulcer of other part of left lower leg with unspecified severity: Secondary | ICD-10-CM | POA: Diagnosis present

## 2022-03-12 DIAGNOSIS — R031 Nonspecific low blood-pressure reading: Secondary | ICD-10-CM | POA: Diagnosis present

## 2022-03-12 DIAGNOSIS — Z89611 Acquired absence of right leg above knee: Secondary | ICD-10-CM

## 2022-03-12 DIAGNOSIS — I2510 Atherosclerotic heart disease of native coronary artery without angina pectoris: Secondary | ICD-10-CM | POA: Diagnosis present

## 2022-03-12 HISTORY — PX: APPLICATION OF WOUND VAC: SHX5189

## 2022-03-12 HISTORY — DX: Polyneuropathy, unspecified: G62.9

## 2022-03-12 HISTORY — DX: Dependence on wheelchair: Z99.3

## 2022-03-12 HISTORY — DX: Gastro-esophageal reflux disease without esophagitis: K21.9

## 2022-03-12 HISTORY — PX: AMPUTATION: SHX166

## 2022-03-12 LAB — CBC
HCT: 31.1 % — ABNORMAL LOW (ref 39.0–52.0)
Hemoglobin: 9 g/dL — ABNORMAL LOW (ref 13.0–17.0)
MCH: 29.3 pg (ref 26.0–34.0)
MCHC: 28.9 g/dL — ABNORMAL LOW (ref 30.0–36.0)
MCV: 101.3 fL — ABNORMAL HIGH (ref 80.0–100.0)
Platelets: 475 K/uL — ABNORMAL HIGH (ref 150–400)
RBC: 3.07 MIL/uL — ABNORMAL LOW (ref 4.22–5.81)
RDW: 16.3 % — ABNORMAL HIGH (ref 11.5–15.5)
WBC: 5.8 K/uL (ref 4.0–10.5)
nRBC: 0 % (ref 0.0–0.2)

## 2022-03-12 LAB — APTT: aPTT: 34 s (ref 24–36)

## 2022-03-12 LAB — COMPREHENSIVE METABOLIC PANEL
ALT: 10 U/L (ref 0–44)
AST: 19 U/L (ref 15–41)
Albumin: 2.2 g/dL — ABNORMAL LOW (ref 3.5–5.0)
Alkaline Phosphatase: 76 U/L (ref 38–126)
Anion gap: 14 (ref 5–15)
BUN: 22 mg/dL — ABNORMAL HIGH (ref 6–20)
CO2: 21 mmol/L — ABNORMAL LOW (ref 22–32)
Calcium: 8.6 mg/dL — ABNORMAL LOW (ref 8.9–10.3)
Chloride: 103 mmol/L (ref 98–111)
Creatinine, Ser: 1.44 mg/dL — ABNORMAL HIGH (ref 0.61–1.24)
GFR, Estimated: 58 mL/min — ABNORMAL LOW (ref >60)
Glucose, Bld: 78 mg/dL (ref 70–99)
Potassium: 4.5 mmol/L (ref 3.5–5.1)
Sodium: 138 mmol/L (ref 135–145)
Total Bilirubin: 0.3 mg/dL (ref 0.3–1.2)
Total Protein: 6.4 g/dL — ABNORMAL LOW (ref 6.5–8.1)

## 2022-03-12 LAB — PROTIME-INR
INR: 1.2 (ref 0.8–1.2)
Prothrombin Time: 15.5 seconds — ABNORMAL HIGH (ref 11.4–15.2)

## 2022-03-12 LAB — HIV ANTIBODY (ROUTINE TESTING W REFLEX): HIV Screen 4th Generation wRfx: NONREACTIVE

## 2022-03-12 LAB — SURGICAL PCR SCREEN
MRSA, PCR: NEGATIVE
Staphylococcus aureus: NEGATIVE

## 2022-03-12 LAB — HEPATITIS B SURFACE ANTIGEN: Hepatitis B Surface Ag: NONREACTIVE

## 2022-03-12 SURGERY — AMPUTATION, ABOVE KNEE
Anesthesia: General | Site: Leg Upper | Laterality: Bilateral

## 2022-03-12 MED ORDER — ROCURONIUM BROMIDE 10 MG/ML (PF) SYRINGE
PREFILLED_SYRINGE | INTRAVENOUS | Status: DC | PRN
  Administered 2022-03-12: 60 mg via INTRAVENOUS

## 2022-03-12 MED ORDER — ONDANSETRON HCL 4 MG/2ML IJ SOLN
INTRAMUSCULAR | Status: DC | PRN
  Administered 2022-03-12: 4 mg via INTRAVENOUS

## 2022-03-12 MED ORDER — ACETAMINOPHEN 10 MG/ML IV SOLN
1000.0000 mg | Freq: Once | INTRAVENOUS | Status: DC | PRN
  Administered 2022-03-12: 1000 mg via INTRAVENOUS

## 2022-03-12 MED ORDER — CARVEDILOL 25 MG PO TABS
25.0000 mg | ORAL_TABLET | Freq: Two times a day (BID) | ORAL | Status: DC
  Administered 2022-03-12 – 2022-03-16 (×7): 25 mg via ORAL
  Filled 2022-03-12 (×7): qty 1

## 2022-03-12 MED ORDER — SODIUM CHLORIDE 0.9 % IV SOLN: INTRAVENOUS | Status: DC

## 2022-03-12 MED ORDER — HYDROCODONE-ACETAMINOPHEN 5-325 MG PO TABS
1.0000 | ORAL_TABLET | Freq: Four times a day (QID) | ORAL | Status: DC | PRN
  Administered 2022-03-13 (×2): 1 via ORAL
  Filled 2022-03-12 (×4): qty 1

## 2022-03-12 MED ORDER — OXYCODONE HCL 5 MG/5ML PO SOLN: 5.0000 mg | Freq: Once | ORAL | Status: AC | PRN

## 2022-03-12 MED ORDER — ACETAMINOPHEN 10 MG/ML IV SOLN
INTRAVENOUS | Status: AC
  Filled 2022-03-12: qty 100

## 2022-03-12 MED ORDER — OXYCODONE HCL 5 MG PO TABS
ORAL_TABLET | ORAL | Status: AC
  Filled 2022-03-12: qty 1

## 2022-03-12 MED ORDER — PHENYLEPHRINE 80 MCG/ML (10ML) SYRINGE FOR IV PUSH (FOR BLOOD PRESSURE SUPPORT)
PREFILLED_SYRINGE | INTRAVENOUS | Status: DC | PRN
  Administered 2022-03-12: 160 ug via INTRAVENOUS

## 2022-03-12 MED ORDER — PHENYLEPHRINE HCL-NACL 20-0.9 MG/250ML-% IV SOLN
INTRAVENOUS | Status: DC | PRN
  Administered 2022-03-12: 50 ug/min via INTRAVENOUS

## 2022-03-12 MED ORDER — KETAMINE HCL 50 MG/5ML IJ SOSY
PREFILLED_SYRINGE | INTRAMUSCULAR | Status: AC
  Filled 2022-03-12: qty 5

## 2022-03-12 MED ORDER — PANTOPRAZOLE SODIUM 40 MG PO TBEC
40.0000 mg | DELAYED_RELEASE_TABLET | Freq: Every day | ORAL | Status: DC
  Administered 2022-03-13 – 2022-03-16 (×4): 40 mg via ORAL
  Filled 2022-03-12 (×4): qty 1

## 2022-03-12 MED ORDER — MIDAZOLAM HCL 2 MG/2ML IJ SOLN
INTRAMUSCULAR | Status: DC | PRN
  Administered 2022-03-12 (×2): 1 mg via INTRAVENOUS

## 2022-03-12 MED ORDER — PHENYLEPHRINE 80 MCG/ML (10ML) SYRINGE FOR IV PUSH (FOR BLOOD PRESSURE SUPPORT)
PREFILLED_SYRINGE | INTRAVENOUS | Status: AC
  Filled 2022-03-12: qty 10

## 2022-03-12 MED ORDER — MIDAZOLAM HCL 2 MG/2ML IJ SOLN
INTRAMUSCULAR | Status: AC
  Filled 2022-03-12: qty 2

## 2022-03-12 MED ORDER — CHLORHEXIDINE GLUCONATE CLOTH 2 % EX PADS
6.0000 | MEDICATED_PAD | Freq: Every day | CUTANEOUS | Status: DC
  Administered 2022-03-13 – 2022-03-16 (×4): 6 via TOPICAL

## 2022-03-12 MED ORDER — IPRATROPIUM-ALBUTEROL 0.5-2.5 (3) MG/3ML IN SOLN: 3.0000 mL | RESPIRATORY_TRACT | Status: DC | PRN

## 2022-03-12 MED ORDER — LIDOCAINE 2% (20 MG/ML) 5 ML SYRINGE
INTRAMUSCULAR | Status: DC | PRN
  Administered 2022-03-12: 100 mg via INTRAVENOUS

## 2022-03-12 MED ORDER — GABAPENTIN 400 MG PO CAPS
400.0000 mg | ORAL_CAPSULE | Freq: Three times a day (TID) | ORAL | Status: DC
  Administered 2022-03-12 – 2022-03-16 (×11): 400 mg via ORAL
  Filled 2022-03-12 (×11): qty 1

## 2022-03-12 MED ORDER — CHLORHEXIDINE GLUCONATE 0.12 % MT SOLN
15.0000 mL | Freq: Once | OROMUCOSAL | Status: AC
  Administered 2022-03-12: 15 mL via OROMUCOSAL
  Filled 2022-03-12: qty 15

## 2022-03-12 MED ORDER — ONDANSETRON HCL 4 MG/2ML IJ SOLN
INTRAMUSCULAR | Status: AC
  Filled 2022-03-12: qty 2

## 2022-03-12 MED ORDER — ONDANSETRON HCL 4 MG/2ML IJ SOLN: 4.0000 mg | Freq: Once | INTRAMUSCULAR | Status: DC | PRN

## 2022-03-12 MED ORDER — 0.9 % SODIUM CHLORIDE (POUR BTL) OPTIME
TOPICAL | Status: DC | PRN
  Administered 2022-03-12: 1000 mL

## 2022-03-12 MED ORDER — SODIUM CHLORIDE 0.9% FLUSH
3.0000 mL | Freq: Two times a day (BID) | INTRAVENOUS | Status: DC
  Administered 2022-03-12 – 2022-03-16 (×8): 3 mL via INTRAVENOUS

## 2022-03-12 MED ORDER — CHLORHEXIDINE GLUCONATE CLOTH 2 % EX PADS: 6.0000 | MEDICATED_PAD | Freq: Once | CUTANEOUS | Status: DC

## 2022-03-12 MED ORDER — ALBUMIN HUMAN 5 % IV SOLN: INTRAVENOUS | Status: DC | PRN

## 2022-03-12 MED ORDER — HYDROMORPHONE HCL 1 MG/ML IJ SOLN
0.2500 mg | INTRAMUSCULAR | Status: DC | PRN
  Administered 2022-03-12 (×3): 0.5 mg via INTRAVENOUS
  Administered 2022-03-12 (×2): 0.25 mg via INTRAVENOUS

## 2022-03-12 MED ORDER — LEVETIRACETAM 500 MG PO TABS
500.0000 mg | ORAL_TABLET | Freq: Two times a day (BID) | ORAL | Status: DC
  Administered 2022-03-12 – 2022-03-16 (×8): 500 mg via ORAL
  Filled 2022-03-12 (×8): qty 1

## 2022-03-12 MED ORDER — ATORVASTATIN CALCIUM 40 MG PO TABS
40.0000 mg | ORAL_TABLET | Freq: Every day | ORAL | Status: DC
  Administered 2022-03-12 – 2022-03-15 (×4): 40 mg via ORAL
  Filled 2022-03-12 (×4): qty 1

## 2022-03-12 MED ORDER — DEXAMETHASONE SODIUM PHOSPHATE 10 MG/ML IJ SOLN
INTRAMUSCULAR | Status: DC | PRN
  Administered 2022-03-12: 8 mg via INTRAVENOUS

## 2022-03-12 MED ORDER — FENTANYL CITRATE (PF) 250 MCG/5ML IJ SOLN
INTRAMUSCULAR | Status: AC
  Filled 2022-03-12: qty 5

## 2022-03-12 MED ORDER — KETAMINE HCL 10 MG/ML IJ SOLN
INTRAMUSCULAR | Status: DC | PRN
  Administered 2022-03-12: 10 mg via INTRAVENOUS
  Administered 2022-03-12: 15 mg via INTRAVENOUS

## 2022-03-12 MED ORDER — SUGAMMADEX SODIUM 200 MG/2ML IV SOLN
INTRAVENOUS | Status: DC | PRN
  Administered 2022-03-12: 200 mg via INTRAVENOUS

## 2022-03-12 MED ORDER — HYDROMORPHONE HCL 1 MG/ML IJ SOLN
0.5000 mg | INTRAMUSCULAR | Status: DC | PRN
  Administered 2022-03-12: 0.5 mg via INTRAVENOUS
  Administered 2022-03-12: 1 mg via INTRAVENOUS
  Administered 2022-03-13: 0.5 mg via INTRAVENOUS
  Administered 2022-03-13 (×3): 1 mg via INTRAVENOUS
  Administered 2022-03-13: 0.5 mg via INTRAVENOUS
  Filled 2022-03-12 (×5): qty 1

## 2022-03-12 MED ORDER — ROCURONIUM BROMIDE 10 MG/ML (PF) SYRINGE
PREFILLED_SYRINGE | INTRAVENOUS | Status: AC
  Filled 2022-03-12: qty 10

## 2022-03-12 MED ORDER — TAMSULOSIN HCL 0.4 MG PO CAPS
0.4000 mg | ORAL_CAPSULE | Freq: Every day | ORAL | Status: DC
  Administered 2022-03-13: 0.4 mg via ORAL
  Filled 2022-03-12 (×4): qty 1

## 2022-03-12 MED ORDER — FENTANYL CITRATE (PF) 250 MCG/5ML IJ SOLN
INTRAMUSCULAR | Status: DC | PRN
  Administered 2022-03-12: 100 ug via INTRAVENOUS
  Administered 2022-03-12 (×3): 50 ug via INTRAVENOUS

## 2022-03-12 MED ORDER — NEPRO/CARBSTEADY PO LIQD
237.0000 mL | Freq: Two times a day (BID) | ORAL | Status: DC
  Administered 2022-03-13 – 2022-03-14 (×3): 237 mL via ORAL

## 2022-03-12 MED ORDER — ACETAMINOPHEN 325 MG PO TABS
650.0000 mg | ORAL_TABLET | Freq: Four times a day (QID) | ORAL | Status: DC | PRN
  Administered 2022-03-14: 650 mg via ORAL
  Filled 2022-03-12: qty 2

## 2022-03-12 MED ORDER — HYDROMORPHONE HCL 1 MG/ML IJ SOLN
INTRAMUSCULAR | Status: AC
  Filled 2022-03-12: qty 1

## 2022-03-12 MED ORDER — CEFAZOLIN SODIUM-DEXTROSE 2-4 GM/100ML-% IV SOLN
2.0000 g | INTRAVENOUS | Status: AC
  Administered 2022-03-12: 2 g via INTRAVENOUS
  Filled 2022-03-12: qty 100

## 2022-03-12 MED ORDER — ORAL CARE MOUTH RINSE: 15.0000 mL | Freq: Once | OROMUCOSAL | Status: AC

## 2022-03-12 MED ORDER — ISOSORBIDE DINITRATE 10 MG PO TABS
10.0000 mg | ORAL_TABLET | Freq: Three times a day (TID) | ORAL | Status: DC
  Administered 2022-03-12 – 2022-03-16 (×10): 10 mg via ORAL
  Filled 2022-03-12 (×13): qty 1

## 2022-03-12 MED ORDER — AMISULPRIDE (ANTIEMETIC) 5 MG/2ML IV SOLN: 10.0000 mg | Freq: Once | INTRAVENOUS | Status: DC | PRN

## 2022-03-12 MED ORDER — DEXAMETHASONE SODIUM PHOSPHATE 10 MG/ML IJ SOLN
INTRAMUSCULAR | Status: AC
  Filled 2022-03-12: qty 1

## 2022-03-12 MED ORDER — OXYCODONE HCL 5 MG PO TABS
5.0000 mg | ORAL_TABLET | Freq: Once | ORAL | Status: AC | PRN
  Administered 2022-03-12: 5 mg via ORAL

## 2022-03-12 MED ORDER — LIDOCAINE 2% (20 MG/ML) 5 ML SYRINGE
INTRAMUSCULAR | Status: AC
  Filled 2022-03-12: qty 5

## 2022-03-12 MED ORDER — DULOXETINE HCL 30 MG PO CPEP
30.0000 mg | ORAL_CAPSULE | Freq: Two times a day (BID) | ORAL | Status: DC
  Administered 2022-03-12 – 2022-03-16 (×8): 30 mg via ORAL
  Filled 2022-03-12 (×8): qty 1

## 2022-03-12 MED ORDER — ACETAMINOPHEN 650 MG RE SUPP: 650.0000 mg | Freq: Four times a day (QID) | RECTAL | Status: DC | PRN

## 2022-03-12 MED ORDER — PROPOFOL 10 MG/ML IV BOLUS
INTRAVENOUS | Status: DC | PRN
  Administered 2022-03-12: 140 mg via INTRAVENOUS

## 2022-03-12 SURGICAL SUPPLY — 58 items
APL PRP STRL LF DISP 70% ISPRP (MISCELLANEOUS) ×4
BAG COUNTER SPONGE SURGICOUNT (BAG) ×3 IMPLANT
BAG SPNG CNTER NS LX DISP (BAG) ×2
BANDAGE ESMARK 6X9 LF (GAUZE/BANDAGES/DRESSINGS) ×3 IMPLANT
BLADE SAGITTAL 25.0X1.19X90 (BLADE) ×3 IMPLANT
BLADE SURG 21 STRL SS (BLADE) ×3 IMPLANT
BNDG CMPR 9X6 STRL LF SNTH (GAUZE/BANDAGES/DRESSINGS) ×4
BNDG COHESIVE 6X5 TAN STRL LF (GAUZE/BANDAGES/DRESSINGS) ×3 IMPLANT
BNDG ELASTIC 4X5.8 VLCR STR LF (GAUZE/BANDAGES/DRESSINGS) ×3 IMPLANT
BNDG ELASTIC 6X5.8 VLCR STR LF (GAUZE/BANDAGES/DRESSINGS) ×3 IMPLANT
BNDG ESMARK 6X9 LF (GAUZE/BANDAGES/DRESSINGS) ×4
BNDG GAUZE DERMACEA FLUFF 4 (GAUZE/BANDAGES/DRESSINGS) ×3 IMPLANT
BNDG GZE DERMACEA 4 6PLY (GAUZE/BANDAGES/DRESSINGS) ×2
CANISTER SUCT 3000ML PPV (MISCELLANEOUS) ×3 IMPLANT
CANISTER WOUND CARE 500ML ATS (WOUND CARE) IMPLANT
CHLORAPREP W/TINT 26 (MISCELLANEOUS) ×3 IMPLANT
CLIP LIGATING EXTRA MED SLVR (CLIP) IMPLANT
CLIP LIGATING EXTRA SM BLUE (MISCELLANEOUS) IMPLANT
COVER SURGICAL LIGHT HANDLE (MISCELLANEOUS) ×3 IMPLANT
DRAPE HALF SHEET 40X57 (DRAPES) ×3 IMPLANT
DRAPE ORTHO SPLIT 77X108 STRL (DRAPES) ×4
DRAPE SURG ORHT 6 SPLT 77X108 (DRAPES) ×6 IMPLANT
DRESSING PREVENA PLUS CUSTOM (GAUZE/BANDAGES/DRESSINGS) IMPLANT
DRSG PREVENA PLUS CUSTOM (GAUZE/BANDAGES/DRESSINGS) ×2
DRSG VAC ATS MED SENSATRAC (GAUZE/BANDAGES/DRESSINGS) IMPLANT
ELECT CAUTERY BLADE 6.4 (BLADE) ×3 IMPLANT
ELECT REM PT RETURN 9FT ADLT (ELECTROSURGICAL) ×2
ELECTRODE REM PT RTRN 9FT ADLT (ELECTROSURGICAL) ×3 IMPLANT
GAUZE SPONGE 4X4 12PLY STRL (GAUZE/BANDAGES/DRESSINGS) ×3 IMPLANT
GAUZE XEROFORM 5X9 LF (GAUZE/BANDAGES/DRESSINGS) ×3 IMPLANT
GLOVE BIO SURGEON STRL SZ8 (GLOVE) ×3 IMPLANT
GLOVE BIOGEL PI IND STRL 6 (GLOVE) IMPLANT
GLOVE SS BIOGEL STRL SZ 7 (GLOVE) IMPLANT
GOWN STRL REUS W/ TWL LRG LVL3 (GOWN DISPOSABLE) ×6 IMPLANT
GOWN STRL REUS W/ TWL XL LVL3 (GOWN DISPOSABLE) ×3 IMPLANT
GOWN STRL REUS W/TWL LRG LVL3 (GOWN DISPOSABLE) ×4
GOWN STRL REUS W/TWL XL LVL3 (GOWN DISPOSABLE) ×2
KIT BASIN OR (CUSTOM PROCEDURE TRAY) ×3 IMPLANT
KIT TURNOVER KIT B (KITS) ×3 IMPLANT
NS IRRIG 1000ML POUR BTL (IV SOLUTION) ×3 IMPLANT
PACK GENERAL/GYN (CUSTOM PROCEDURE TRAY) ×3 IMPLANT
PAD ARMBOARD 7.5X6 YLW CONV (MISCELLANEOUS) ×6 IMPLANT
PENCIL SMOKE EVACUATOR (MISCELLANEOUS) ×3 IMPLANT
STAPLER VISISTAT 35W (STAPLE) ×3 IMPLANT
STOCKINETTE IMPERVIOUS LG (DRAPES) ×3 IMPLANT
SUT FIBERWIRE #5 38 CONV BLUE (SUTURE)
SUT SILK 0 TIES 10X30 (SUTURE) ×3 IMPLANT
SUT SILK 2 0 (SUTURE) ×2
SUT SILK 2 0 SH CR/8 (SUTURE) ×3 IMPLANT
SUT SILK 2-0 18XBRD TIE 12 (SUTURE) ×3 IMPLANT
SUT SILK 3 0 (SUTURE)
SUT SILK 3-0 18XBRD TIE 12 (SUTURE) IMPLANT
SUT VIC AB 2-0 CT1 18 (SUTURE) ×6 IMPLANT
SUTURE FIBERWR #5 38 CONV BLUE (SUTURE) IMPLANT
TAPE UMBILICAL COTTON 1/8X30 (MISCELLANEOUS) ×3 IMPLANT
TOWEL GREEN STERILE (TOWEL DISPOSABLE) ×6 IMPLANT
UNDERPAD 30X36 HEAVY ABSORB (UNDERPADS AND DIAPERS) ×3 IMPLANT
WATER STERILE IRR 1000ML POUR (IV SOLUTION) ×3 IMPLANT

## 2022-03-12 NOTE — Transfer of Care (Signed)
Immediate Anesthesia Transfer of Care Note  Patient: [name]  Procedure(s) Performed: AMPUTATION ABOVE KNEE (Bilateral: Knee) APPLICATION OF WOUND VAC (Bilateral: Leg Upper)  Patient Location: PACU  Anesthesia Type:General  Level of Consciousness: awake and drowsy  Airway & Oxygen Therapy: Patient Spontanous Breathing and Patient connected to face mask oxygen  Post-op Assessment: Report given to RN and Post -op Vital signs reviewed and stable  Post vital signs: Reviewed and stable  Last Vitals:  Vitals Value Taken Time  BP 127/86 03/12/22 1522  Temp 36.2 C 03/12/22 1521  Pulse 95 03/12/22 1527  Resp 14 03/12/22 1527  SpO2 100 % 03/12/22 1527  Vitals shown include unvalidated device data.  Last Pain:  Vitals:   03/12/22 1521  TempSrc:   PainSc: Asleep         Complications: No notable events documented.

## 2022-03-12 NOTE — Anesthesia Postprocedure Evaluation (Signed)
Anesthesia Post Note  Patient: [name]  Procedure(s) Performed: AMPUTATION ABOVE KNEE (Bilateral: Knee) APPLICATION OF WOUND VAC (Bilateral: Leg Upper)     Patient location during evaluation: PACU Anesthesia Type: General Level of consciousness: awake and alert Pain management: pain level controlled Vital Signs Assessment: post-procedure vital signs reviewed and stable Respiratory status: spontaneous breathing, nonlabored ventilation, respiratory function stable and patient connected to nasal cannula oxygen Cardiovascular status: blood pressure returned to baseline and stable Postop Assessment: no apparent nausea or vomiting Anesthetic complications: no  No notable events documented.  Last Vitals:  Vitals:   03/12/22 1645 03/12/22 1700  BP: (!) 146/88 (!) 108/94  Pulse: 89 88  Resp: 19 12  Temp:    SpO2: 98% 98%    Last Pain:  Vitals:   03/12/22 1630  TempSrc:   PainSc: [place]

## 2022-03-12 NOTE — Interval H&P Note (Signed)
History and Physical Interval Note:  03/12/2022 1:34 PM  [name]  has presented today for surgery, with the diagnosis of PAD.  The various methods of treatment have been discussed with the patient and family. After consideration of risks, benefits and other options for treatment, the patient has consented to  Procedure(s): AMPUTATION ABOVE KNEE (Bilateral) as a surgical intervention.  The patient's history has been reviewed, patient examined, no change in status, stable for surgery.  I have reviewed the patient's chart and labs.  Questions were answered to the patient's satisfaction.     Cherre Robins

## 2022-03-12 NOTE — Consult Note (Signed)
Reason for Consult: ESRD Referring Physician:  Dr. Beola Cord  Chief Complaint: Pain in BKA's  [facility] MWF 2nd shift 4hrs  (last HD Wed) (Call for orders)  Assessment/Plan: ESRD - MWF @ [facility]; will call in the AM for orders but will need a new EDW to be established post b/l AKA's. Plan on dialysis in the AM. Renal osteodystrophy - will check a phos for binder management Anemia - Hb 9; iron panel, ESA + iron as needed; transfuse if needed. PAD with distal ischemia s/p b/l AKA's successfully on 2/2. COPD Malnutrition - additional protein shakes qdaily; nepro BID   HPI: [name] is an 57 y.o. male seizures, PAD, COPD, chronic pain, HTN, ESRD MWF at [facility] with last dialysis treatment on Wednesday. 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 He had successful b/l AKA's by Dr. Lenell Antu and  in the PACU vitals were stable with the patient in discomfort. Of note his BUN/Cr was 24/1.44. He denies shortness of breath, fever, chills, nausea or chest pain.  ROS Pertinent items are noted in HPI.  Chemistry and CBC: Creatinine, Ser  Date/Time Value Ref Range Status  03/12/2022 11:04 AM 1.44 (H) 0.61 - 1.24 mg/dL Final  85/46/2703 50:09 AM 1.32 0.50 - 1.35 mg/dL Final   Recent Labs  Lab 03/12/22 1104  NA 138  K 4.5  CL 103  CO2 21*  GLUCOSE 78  BUN 22*  CREATININE 1.44*  CALCIUM 8.6*   Recent Labs  Lab 03/12/22 1104  WBC 5.8  HGB 9.0*  HCT 31.1*  MCV 101.3*  PLT 475*   Liver Function Tests: Recent Labs  Lab 03/12/22 1104  AST 19  ALT 10  ALKPHOS 76  BILITOT 0.3  PROT 6.4*  ALBUMIN 2.2*   No results for input(s): "LIPASE", "AMYLASE" in the last 168 hours. No results for input(s): "AMMONIA" in the last 168 hours. Cardiac Enzymes: No results for input(s): "CKTOTAL", "CKMB", "CKMBINDEX", "TROPONINI" in the last 168 hours. Iron Studies: No results for input(s): "IRON", "TIBC",  "TRANSFERRIN", "FERRITIN" in the last 72 hours. PT/INR: @LABRCNTIP (inr:5)  Xrays/Other Studies: ) Results for orders placed or performed during the hospital encounter of 03/12/22 (from the past 48 hour(s))  APTT     Status: None   Collection Time: 03/12/22 11:04 AM  Result Value Ref Range   aPTT 34 24 - 36 seconds    Comment: Performed at [facility], [address] [place], [place]  CBC     Status: Abnormal   Collection Time: 03/12/22 11:04 AM  Result Value Ref Range   WBC 5.8 4.0 - 10.5 K/uL   RBC 3.07 (L) 4.22 - 5.81 MIL/uL   Hemoglobin 9.0 (L) 13.0 - 17.0 g/dL   HCT 05/11/22 (L) 99.3 - 71.6 %   MCV 101.3 (H) 80.0 - 100.0 fL   MCH 29.3 26.0 - 34.0 pg   MCHC 28.9 (L) 30.0 - 36.0 g/dL   RDW 96.7 (H) 89.3 - 81.0 %   Platelets 475 (H) 150 - 400 K/uL   nRBC 0.0 0.0 - 0.2 %    Comment: Performed at [facility], [address], [place], [place]  Comprehensive metabolic panel     Status: Abnormal   Collection Time: 03/12/22 11:04 AM  Result Value Ref Range   Sodium 138 135 - 145 mmol/L   Potassium 4.5 3.5 - 5.1  mmol/L   Chloride 103 98 - 111 mmol/L   CO2 21 (L) 22 - 32 mmol/L   Glucose, Bld 78 70 - 99 mg/dL    Comment: Glucose reference range applies only to samples taken after fasting for at least 8 hours.   BUN 22 (H) 6 - 20 mg/dL   Creatinine, Ser 1.44 (H) 0.61 - 1.24 mg/dL   Calcium 8.6 (L) 8.9 - 10.3 mg/dL   Total Protein 6.4 (L) 6.5 - 8.1 g/dL   Albumin 2.2 (L) 3.5 - 5.0 g/dL   AST 19 15 - 41 U/L   ALT 10 0 - 44 U/L   Alkaline Phosphatase 76 38 - 126 U/L   Total Bilirubin 0.3 0.3 - 1.2 mg/dL   GFR, Estimated 58 (L) >60 mL/min    Comment: (NOTE) Calculated using the CKD-EPI Creatinine Equation (2021)    Anion gap 14 5 - 15    Comment: Performed at [facility], [place] [address] [place], [place] [address]  Protime-INR     Status: Abnormal   Collection Time: 03/12/22 11:04 AM  Result Value Ref Range   Prothrombin Time 15.5 (H)  11.4 - 15.2 seconds   INR 1.2 0.8 - 1.2    Comment: (NOTE) INR goal varies based on device and disease states. Performed at [facility], [place] [address] [place], [place] [address]   Type and screen     Status: None   Collection Time: 03/12/22 11:45 AM  Result Value Ref Range   ABO/RH(D) A NEG    Antibody Screen NEG    Sample Expiration      03/15/2022,2359 Performed at [facility], [place] [address] [place], [place] [address]   Surgical pcr screen     Status: None   Collection Time: 03/12/22 12:49 PM   Specimen: Nasal Mucosa; Nasal Swab  Result Value Ref Range   MRSA, PCR NEGATIVE NEGATIVE   Staphylococcus aureus NEGATIVE NEGATIVE    Comment: (NOTE) The Xpert SA Assay (FDA approved for NASAL specimens in patients 71 years of age and older), is one component of a comprehensive surveillance program. It is not intended to diagnose infection nor to guide or monitor treatment. Performed at [facility], [place] [address], [place], [place] [address]    No results found.  PMH:   Past Medical History:  Diagnosis Date   Anemia    CHF (congestive heart failure) (HCC)    COPD (chronic obstructive pulmonary disease) (HCC)    Coronary artery disease    Depression    GERD (gastroesophageal reflux disease)    Hypertension    Neuropathy 03/12/2022   Peripheral vascular disease ([place])    Seizures ([place])    last one was "years ago"   Wheelchair dependent     PSH:   Past Surgical History:  Procedure Laterality Date   bilateral below knee amputation Bilateral    x 2   VASCULAR SURGERY     VENA CAVA FILTER PLACEMENT N/A 12/31/2012   Procedure: INSERTION VENA-CAVA FILTER Femoral approach;  Surgeon: Angelia Mould, MD;  Location: [facility] OR;  Service: Vascular;  Laterality: N/A;    Allergies: No Known Allergies  Medications:   Prior to Admission medications   Medication Sig Start Date End Date Taking? Authorizing Provider  aspirin EC 81 MG tablet Take 81 mg by  mouth daily. 01/14/22 01/14/23 Yes [provider]  atorvastatin (LIPITOR) 40 MG tablet Take 40 mg by mouth at  bedtime. 01/31/22  Yes [provider]  carvedilol (COREG) 25 MG tablet Take 25 mg by mouth 2 (two) times daily with a meal. 01/31/22  Yes [provider]  clopidogrel (PLAVIX) 75 MG tablet Take 75 mg by mouth daily.   Yes [provider]  DULoxetine (CYMBALTA) 30 MG capsule Take 30 mg by mouth 2 (two) times daily. 01/25/22  Yes [provider]  gabapentin (NEURONTIN) 400 MG capsule Take 400 mg by mouth 3 (three) times daily.   Yes [provider]  HYDROcodone-acetaminophen (NORCO) 10-325 MG tablet Take 1 tablet by mouth every 6 (six) hours. 02/23/22 04/14/22 Yes [provider]  ipratropium-albuterol (DUONEB) 0.5-2.5 (3) MG/3ML SOLN Inhale 3 mLs into the lungs every 4 (four) hours as needed (COPD). 02/09/22 03/11/22 Yes [provider]  isosorbide dinitrate (ISORDIL) 10 MG tablet Take 10 mg by mouth 3 (three) times daily. 01/25/22  Yes [provider]  levETIRAcetam (KEPPRA) 500 MG tablet Take 500 mg by mouth 2 (two) times daily. 02/25/22 02/25/23 Yes [provider]  levocetirizine (XYZAL) 5 MG tablet Take 5 mg by mouth every evening.   Yes [provider]  meclizine (ANTIVERT) 25 MG tablet Take 25 mg by mouth 3 (three) times daily as needed for dizziness.   Yes [provider]  pantoprazole (PROTONIX) 40 MG tablet Take 40 mg by mouth daily. 02/25/22 02/25/23 Yes [provider]  pyridOXINE (VITAMIN B6) 100 MG tablet Take 100 mg by mouth daily.   Yes [provider]  tamsulosin (FLOMAX) 0.4 MG CAPS capsule Take 0.4 mg by mouth daily. 08/05/21 08/05/22 Yes [provider]  thiamine (VITAMIN B1) 100 MG tablet Take 100 mg by mouth daily. 02/25/22 02/25/23 Yes [provider]  MOVANTIK 25 MG TABS tablet Take 25 mg by mouth daily as needed (Constipation).    [provider]  tadalafil (CIALIS) 20 MG tablet Take 20 mg by mouth daily as needed for erectile dysfunction. 11/04/21   [provider]    Discontinued Meds:   Medications Discontinued During This Encounter  Medication Reason   amLODipine (NORVASC) 10 MG tablet Patient Preference   celecoxib (CELEBREX) 200 MG capsule Patient Preference   Cholecalciferol (VITAMIN D-3) 1000 UNITS CAPS Patient Preference   divalproex (DEPAKOTE) 250 MG DR tablet Patient Preference   cloNIDine (CATAPRES) 0.1 MG tablet Patient Preference   cilostazol (PLETAL) 100 MG tablet Patient Preference   lisinopril (ZESTRIL) 5 MG tablet Patient Preference   Magnesium Hydroxide (MILK OF MAGNESIA PO) Patient Preference   megestrol (MEGACE) 400 MG/10ML suspension Patient Preference   sildenafil (VIAGRA) 100 MG tablet Patient Preference   phenytoin (DILANTIN) 100 MG ER capsule Patient Preference   senna (SENOKOT) 8.6 MG TABS tablet Patient Preference   0.9 % irrigation (POUR BTL) Patient Discharge   0.9 %  sodium chloride infusion    Chlorhexidine Gluconate Cloth 2 % PADS 6 each    Chlorhexidine Gluconate Cloth 2 % PADS 6 each    0.9 %  sodium chloride infusion     Social History:  reports that he has been smoking cigarettes. He has a 30.00 pack-year smoking history. He has never used smokeless tobacco. He reports that he does not currently use alcohol. He reports current drug use. Drug: Marijuana.  Family History:  History reviewed. No pertinent family history.  Blood pressure (!) 108/94, pulse 88, temperature (!) 97.1 F (36.2 C), resp. rate 12, height 5\' 10"  (1.778 m), weight 47.6 kg, SpO2 98 %.  General appearance: alert, cooperative, and appears stated age Head: NCAT Eyes: negative Neck: no adenopathy, no carotid bruit, no JVD, supple, symmetrical, trachea midline, and thyroid not enlarged, symmetric, no tenderness/mass/nodules Back: symmetric, no curvature. ROM normal. No CVA tenderness. Resp: clear to  auscultation bilaterally Cardio: regular rate and rhythm GI: soft, non-tender; bowel sounds normal; no masses,  no organomegaly Extremities: B/L AKA's with wound vacs in place Access: RIJ TC       Dwana Melena, MD 03/12/2022, 5:09 PM

## 2022-03-12 NOTE — H&P (Addendum)
History and Physical   [name] ASN:[id number] DOB: Dec 08, 1965 DOA: 03/12/2022  PCP: Lorelee Market, MD   Patient coming from: Home  Chief Complaint: ischemia of bilateral BKA sites  HPI: [name] is a 57 y.o. male with medical history significant of seizures, PAD, COPD, chronic pain, CHF, anemia, depression, GERD, hypertension, ESRD on HD MWF, neuropathy presenting for surgical intervention of bilateral BKA ischemia.  Patient with known history of significant PAD and prior failed 3 echo femoral bypass resulting in bilateral BKA's in the past.  Has been experiencing ischemia of his distal stump in the setting of distal aortic occlusion.  Was seen by vascular surgery and recommended bilateral AKA with counseling that his aortic disease may make it difficult for this to heal as well.  He presents today for surgical intervention and had successful bilateral AKA performed without complication.  Course: Vital signs pre and postoperatively appear to be stable with blood pressure in the 140s.  Lab workup included CMP with bicarb 21, BUN 24, creatinine 1.44 in the setting of ESRD, calcium 8.6, protein 6.4, albumin 2.2.  CBC with hemoglobin of 9.0 near baseline of 8-9, platelets 475.  PT elevated to 15.5 and INR normal, PTT normal.  MRSA screen negative.  Surgical pathology pending.  Type and screen performed.  Patient received Ancef perioperatively.  At some low blood pressure readings in the 193X systolic in the PACU, still had an arterial line in which indicated blood pressure actually in the 902I systolic.  Subsequent cuff pressures are starting to correlate better.  Review of Systems: As per HPI otherwise all other systems reviewed and are negative.  Past Medical History:  Diagnosis Date   Anemia    CHF (congestive heart failure) (HCC)    COPD (chronic obstructive pulmonary disease) (HCC)    Coronary artery disease    Depression    GERD (gastroesophageal reflux disease)     Hypertension    Neuropathy 03/12/2022   Peripheral vascular disease ([place])    Seizures ([place])    last one was "years ago"   Wheelchair dependent     Past Surgical History:  Procedure Laterality Date   bilateral below knee amputation Bilateral    x 2   VASCULAR SURGERY     VENA CAVA FILTER PLACEMENT N/A 12/31/2012   Procedure: INSERTION VENA-CAVA FILTER Femoral approach;  Surgeon: Angelia Mould, MD;  Location: [facility] OR;  Service: Vascular;  Laterality: N/A;    Social History  reports that he has been smoking cigarettes. He has a 30.00 pack-year smoking history. He has never used smokeless tobacco. He reports that he does not currently use alcohol. He reports current drug use. Drug: Marijuana.  No Known Allergies  History reviewed. No pertinent family history.   Prior to Admission medications   Medication Sig Start Date End Date Taking? Authorizing Provider  aspirin EC 81 MG tablet Take 81 mg by mouth daily. 01/14/22 01/14/23 Yes [provider]  atorvastatin (LIPITOR) 40 MG tablet Take 40 mg by mouth at bedtime. 01/31/22  Yes [provider]  carvedilol (COREG) 25 MG tablet Take 25 mg by mouth 2 (two) times daily with a meal. 01/31/22  Yes [provider]  clopidogrel (PLAVIX) 75 MG tablet Take 75 mg by mouth daily.   Yes [provider]  DULoxetine (CYMBALTA) 30 MG capsule Take 30 mg by mouth 2 (two) times daily. 01/25/22  Yes [provider]  gabapentin (NEURONTIN) 400 MG capsule Take 400 mg by  mouth 3 (three) times daily.   Yes [provider]  HYDROcodone-acetaminophen (NORCO) 10-325 MG tablet Take 1 tablet by mouth every 6 (six) hours. 02/23/22 04/14/22 Yes [provider]  ipratropium-albuterol (DUONEB) 0.5-2.5 (3) MG/3ML SOLN Inhale 3 mLs into the lungs every 4 (four) hours as needed (COPD). 02/09/22 03/11/22 Yes [provider]  isosorbide dinitrate (ISORDIL) 10 MG tablet Take 10 mg by mouth 3 (three) times  daily. 01/25/22  Yes [provider]  levETIRAcetam (KEPPRA) 500 MG tablet Take 500 mg by mouth 2 (two) times daily. 02/25/22 02/25/23 Yes [provider]  levocetirizine (XYZAL) 5 MG tablet Take 5 mg by mouth every evening.   Yes [provider]  meclizine (ANTIVERT) 25 MG tablet Take 25 mg by mouth 3 (three) times daily as needed for dizziness.   Yes [provider]  pantoprazole (PROTONIX) 40 MG tablet Take 40 mg by mouth daily. 02/25/22 02/25/23 Yes [provider]  pyridOXINE (VITAMIN B6) 100 MG tablet Take 100 mg by mouth daily.   Yes [provider]  tamsulosin (FLOMAX) 0.4 MG CAPS capsule Take 0.4 mg by mouth daily. 08/05/21 08/05/22 Yes [provider]  thiamine (VITAMIN B1) 100 MG tablet Take 100 mg by mouth daily. 02/25/22 02/25/23 Yes [provider]  MOVANTIK 25 MG TABS tablet Take 25 mg by mouth daily as needed (Constipation).    [provider]  tadalafil (CIALIS) 20 MG tablet Take 20 mg by mouth daily as needed for erectile dysfunction. 11/04/21   [provider]    Physical Exam: Vitals:   03/12/22 1107 03/12/22 1521  BP: (!) 141/92 (!) 130/112  Pulse: 80 92  Resp: 20 15  Temp: 98 F (36.7 C) (!) 97.1 F (36.2 C)  TempSrc: Oral   SpO2: 98% 100%  Weight: 47.6 kg   Height: 5\' 10"  (1.778 m)     Physical Exam Constitutional:      General: He is not in acute distress.    Comments: Thin, tired appearing male  HENT:     Head: Normocephalic and atraumatic.     Mouth/Throat:     Mouth: Mucous membranes are moist.     Pharynx: Oropharynx is clear.  Eyes:     Extraocular Movements: Extraocular movements intact.     Pupils: Pupils are equal, round, and reactive to light.  Cardiovascular:     Rate and Rhythm: Normal rate and regular rhythm.     Pulses: Normal pulses.     Heart sounds: Normal heart sounds.  Pulmonary:     Effort: Pulmonary effort is normal. No respiratory distress.      Breath sounds: Normal breath sounds.  Abdominal:     General: Bowel sounds are normal. There is no distension.     Palpations: Abdomen is soft.     Tenderness: There is no abdominal tenderness.  Musculoskeletal:        General: No swelling or deformity.     Comments: S/P bilateral AKA  Skin:    General: Skin is warm and dry.  Neurological:     General: No focal deficit present.     Mental Status: Mental status is at baseline.    Labs on Admission: I have personally reviewed following labs and imaging studies  CBC: Recent Labs  Lab 03/12/22 1104  WBC 5.8  HGB 9.0*  HCT 31.1*  MCV 101.3*  PLT 475*    Basic Metabolic Panel: Recent Labs  Lab 03/12/22 1104  NA 138  K 4.5  CL 103  CO2 21*  GLUCOSE 78  BUN 22*  CREATININE 1.44*  CALCIUM 8.6*    GFR: Estimated Creatinine Clearance: 39.5 mL/min (A) (by C-G formula based on SCr of 1.44 mg/dL (H)).  Liver Function Tests: Recent Labs  Lab 03/12/22 1104  AST 19  ALT 10  ALKPHOS 76  BILITOT 0.3  PROT 6.4*  ALBUMIN 2.2*    Urine analysis: No results found for: "COLORURINE", "APPEARANCEUR", "LABSPEC", "PHURINE", "GLUCOSEU", "HGBUR", "BILIRUBINUR", "KETONESUR", "PROTEINUR", "UROBILINOGEN", "NITRITE", "LEUKOCYTESUR"  Radiological Exams on Admission: No results found.  EKG: Independently reviewed.  Normal sinus rhythm at 81 bpm.  Nonspecific T wave changes.  Assessment/Plan Principal Problem:   Ischemia of left BKA site [facility]) Active Problems:   PAD (peripheral artery disease) (HCC)   CHF (congestive heart failure) (HCC)   COPD (chronic obstructive pulmonary disease) (HCC)   Opioid dependence (HCC)   Chronic pain syndrome   Seizures (HCC)   Occlusion of terminal aorta (HCC)   Ischemia of right BKA site [facility])   Status post bilateral above knee amputation (HCC)   PAD Ischemia of bilateral BKA sites > Known history of PAD and terminal aorta occlusion.  History of failed through echo femoral bypass leading to  history of BKA bilaterally. > Has been experiencing ischemia of his distal bilateral BKA sites and was recommended for bilateral AKA. > Underwent successful bilateral AKA today.  He has been counseled that there could be risk of difficulty healing given his terminal aortic occlusion. - Appreciate vascular surgery's continued assistance with this patient - Holding home aspirin, Plavix - Pain control per surgical team, currently has IV Tylenol, as needed oxycodone, as needed Dilaudid in the PACU. (Will add additional meds if surgical team unable to address before he gets to the floor) - Continue home atorvastatin  Chronic pain > On Norco at home, pain control as above - Continue home duloxetine, gabapentin  Seizure disorder - Continue home Keppra  ESRD on HD > Has tunneled HD catheter > Last dialysis session 1/31 per patient. Electrolytes stable. - Nephrology consulted for dialysis while patient is admitted  COPD - Continue as needed DuoNebs  GERD - Continue home PPI  CHF > Unclear if reduced or preserved ejection fraction as recent notes send both.  Unable to view echocardiogram from last year and available. > Volume managed by HD - Continue home carvedilol and Isordil  DVT prophylaxis: SCDs for now Code Status:   Full, discussed with patient Family Communication:  None on admission Disposition Plan:   Patient is from:  Home  Anticipated DC to:  Pending further evaluation  Anticipated DC date:  2 to 3 days  Anticipated DC barriers: None  Consults called:  None, vascular surgery continues to follow postoperatively. Admission status:  Inpatient, telemetry surgical  Severity of Illness: The appropriate patient status for this patient is INPATIENT. Inpatient status is judged to be reasonable and necessary in order to provide the required intensity of service to ensure the patient's safety. The patient's presenting symptoms, physical exam findings, and initial radiographic and  laboratory data in the context of their chronic comorbidities is felt to place them at high risk for further clinical deterioration. Furthermore, it is not anticipated that the patient will be medically stable for discharge from the hospital within 2 midnights of admission.   * I certify that at the point of admission it is my clinical judgment that the patient will require inpatient hospital care spanning beyond 2 midnights  from the point of admission due to high intensity of service, high risk for further deterioration and high frequency of surveillance required.Marcelyn Bruins MD [facility]  How to contact the [facility] Attending or Consulting provider [place] or covering provider during after hours [place], for this patient?   Check the care team in [facility] and look for a) attending/consulting [facility] provider listed and b) the [facility] team listed Log into [email or web address] and use [place]'s universal password to access. If you do not have the password, please contact the hospital operator. Locate the [facility] provider you are looking for under [facility] and page to a number that you can be directly reached. If you still have difficulty reaching the provider, please page the [facility] (Director on Call) for the Hospitalists listed on amion for assistance.  03/12/2022, 3:38 PM

## 2022-03-12 NOTE — Op Note (Signed)
DATE OF SERVICE: 03/12/2022  PATIENT:  [name]  57 y.o. male  PRE-OPERATIVE DIAGNOSIS:  ischemic below knee amputation bilaterally  POST-OPERATIVE DIAGNOSIS:  Same  PROCEDURE:   1) left above knee amputation 2) right above knee amputation  SURGEON:  Surgeon(s) and Role:    * Cherre Robins, MD - Primary  ASSISTANT: Paulo Fruit, PA-C  An experienced assistant was required given the complexity of this procedure and the standard of surgical care. My assistant helped with exposure through counter tension, suctioning, ligation and retraction to better visualize the surgical field.  My assistant expedited sewing during the case by following my sutures. Wherever I use the term "we" in the report, my assistant actively helped me with that portion of the procedure.  ANESTHESIA:   general  EBL: 341mL  BLOOD ADMINISTERED:none  DRAINS: none   LOCAL MEDICATIONS USED:  NONE  SPECIMEN:  residual bilateral lower extremities  COUNTS: confirmed correct.  TOURNIQUET:  none  PATIENT DISPOSITION:  PACU - hemodynamically stable.   Delay start of Pharmacological VTE agent (>24hrs) due to surgical blood loss or risk of bleeding: no  INDICATION FOR PROCEDURE: [name] is a 57 y.o. male with ischemic bilateral below knee amputations. After careful discussion of risks, benefits, and alternatives the patient was offered conversion to bilateral above knee amputations. The patient understood and wished to proceed.  OPERATIVE FINDINGS: healthy tissue margins at amputation sites.  DESCRIPTION OF PROCEDURE: After identification of the patient in the pre-operative holding area, the patient was transferred to the operating room. The patient was positioned supine on the operating room table. Anesthesia was induced. The bilateral legs were prepped and draped in standard fashion. A surgical pause was performed confirming correct patient, procedure, and operative location.  A generous left  thigh fishmouth incision was planned on the left lower extremity with a skin marker. A sterile tourniquet was applied to the proximal thigh. An esmarch tourniquet was used to exsanguinate the leg. The pneumatic tourniquet was inflated. An incision was made as planned. The incision was carried down with a #21 scalpel through the subcutaneous tissue, and through the posterior and anterior compartments of the thigh until the femur was encountered. The periosteum about the femur was elevated as high as possible. The femur was transected with a powered saw. The vascular bundle was found in its typical position, and was suture-ligated proximally using a 2-0 silk suture.  The sciatic nerve was identified in typical position, retracted, transected at the retracted margin, and ligated with a 2-0 silk suture.  The tourniquet was released. Hemostasis was achieved in the surgical bed. The wound was copiously irrigated. A myodesis was performed with 2-0 Vicryl. The wound was closed in layers using 2-0 Vicryl, surgical stapler.  A Prevena bandage was applied.  A generous right thigh fishmouth incision was planned on the right lower extremity with a skin marker. A sterile tourniquet was applied to the proximal thigh. An esmarch tourniquet was used to exsanguinate the leg. The pneumatic tourniquet was inflated. An incision was made as planned. The incision was carried down with a #21 scalpel through the subcutaneous tissue, and through the posterior and anterior compartments of the thigh until the femur was encountered. The periosteum about the femur was elevated as high as possible. The femur was transected with a powered saw. The vascular bundle was found in its typical position, and was suture-ligated proximally using a 2-0 silk suture.  The sciatic nerve was identified in typical position,  retracted, transected at the retracted margin, and ligated with a 2-0 silk suture.  The tourniquet was released. Hemostasis was achieved  in the surgical bed. The wound was copiously irrigated. A myodesis was performed with 2-0 Vicryl. The wound was closed in layers using 2-0 Vicryl, surgical stapler.   A Prevena bandage was applied.  Upon completion of the case instrument and sharps counts were confirmed correct. The patient was transferred to the PACU in good condition. I was present for all portions of the procedure.  Yevonne Aline. Stanford Breed, MD [facility] Phone Number: [phone] 03/12/2022 3:17 PM

## 2022-03-12 NOTE — Discharge Instructions (Signed)
[facility]  Discharge instructions  Lower Extremity Amputation  Please refer to the following instruction for your post-procedure care. Your surgeon or physician assistant will discuss any changes with you.  Activity  You are encouraged to walk as much as you can. You can slowly return to normal activities during the month after your surgery. Avoid strenuous activity and heavy lifting until your doctor tells you it's OK. Avoid activities such as vacuuming or swinging a golf club. Do not drive until your doctor give the OK and you are no longer taking prescription pain medications. It is also normal to have difficulty with sleep habits, eating and bowel movement after surgery. These will go away with time.  Bathing/Showering  Shower daily after you go home. Do not soak in a bathtub, hot tub, or swim until the incision heals completely.  Incision Care  Clean your incision with mild soap and water. Shower every day. Pat the area dry with a clean towel. You do not need a bandage unless otherwise instructed. Do not apply any ointments or creams to your incision. If you have open wounds you will be instructed how to care for them or a visiting nurse may be arranged for you. If you have staples or sutures along your incision they will be removed at your post-op appointment. You may have skin glue on your incision. Do not peel it off. It will come off on its own in about one week.  Diet  Resume your normal diet. There are no special food restrictions following this procedure. A low fat/ low cholesterol diet is recommended for all patients with vascular disease. In order to heal from your surgery, it is CRITICAL to get adequate nutrition. Your body requires vitamins, minerals, and protein. Vegetables are the best source of vitamins and minerals. Vegetables also provide the perfect balance of protein. Processed food has little nutritional value, so try to avoid  this.  Medications  Resume taking all your medications unless your doctor or physician assistant tells you not to. If your incision is causing pain, you may take over-the-counter pain relievers such as acetaminophen (Tylenol). If you were prescribed a stronger pain medication, please aware these medication can cause nausea and constipation. Prevent nausea by taking the medication with a snack or meal. Avoid constipation by drinking plenty of fluids and eating foods with high amount of fiber, such as fruits, vegetables, and grains. Take Colace 100 mg (an over-the-counter stool softener) twice a day as needed for constipation.  Do not take Tylenol if you are taking prescription pain medications.  Follow Up  Our office will schedule a follow up appointment 4 weeks following discharge.  Please call us immediately for any of the following conditions  Increase pain, redness, warmth, or drainage (pus) from your incision site(s) Fever of 101 degree or higher The swelling in your leg with the amputation suddenly worsens and becomes more painful than when you were in the hospital  Leg swelling is common after amputation surgery.  The swelling should improve over a few months following surgery. To improve the swelling, you may elevate your legs above the level of your heart while you are sitting or resting. Your surgeon or physician assistant may ask you to apply an ACE wrap or wear compression (TED) stockings to help to reduce swelling.  Reduce your risk of vascular disease  Stop smoking. If you would like help call [facility] at 1-800-QUIT-NOW 905-306-0778) or [place] at (209)106-9500.  Manage  your cholesterol Maintain a desired weight Control your diabetes weight Control your diabetes Keep your blood pressure down  If you have any questions, please call the office at [phone]

## 2022-03-12 NOTE — Anesthesia Procedure Notes (Signed)
Procedure Name: Intubation Date/Time: 03/12/2022 2:03 PM  Performed by: Inda Coke, CRNAPre-anesthesia Checklist: Patient identified, Emergency Drugs available, Suction available, Timeout performed and Patient being monitored Patient Re-evaluated:Patient Re-evaluated prior to induction Oxygen Delivery Method: Circle system utilized Preoxygenation: Pre-oxygenation with 100% oxygen Induction Type: IV induction Ventilation: Mask ventilation without difficulty Laryngoscope Size: Mac and 4 Grade View: Grade I Tube type: Oral Tube size: 7.5 mm Number of attempts: 1 Airway Equipment and Method: Stylet Placement Confirmation: ETT inserted through vocal cords under direct vision, positive ETCO2, CO2 detector and breath sounds checked- equal and bilateral Secured at: 22 cm Tube secured with: Tape Dental Injury: Teeth and Oropharynx as per pre-operative assessment

## 2022-03-13 ENCOUNTER — Encounter (HOSPITAL_COMMUNITY): Payer: Self-pay | Admitting: Vascular Surgery

## 2022-03-13 DIAGNOSIS — I998 Other disorder of circulatory system: Secondary | ICD-10-CM | POA: Diagnosis not present

## 2022-03-13 DIAGNOSIS — T8789 Other complications of amputation stump: Secondary | ICD-10-CM | POA: Diagnosis not present

## 2022-03-13 LAB — RETICULOCYTES
Immature Retic Fract: 36.8 % — ABNORMAL HIGH (ref 2.3–15.9)
RBC.: 1.86 MIL/uL — ABNORMAL LOW (ref 4.22–5.81)
Retic Count, Absolute: 111 10*3/uL (ref 19.0–186.0)
Retic Ct Pct: 6 % — ABNORMAL HIGH (ref 0.4–3.1)

## 2022-03-13 LAB — IRON AND TIBC
Iron: 19 ug/dL — ABNORMAL LOW (ref 45–182)
Saturation Ratios: 10 % — ABNORMAL LOW (ref 17.9–39.5)
TIBC: 186 ug/dL — ABNORMAL LOW (ref 250–450)
UIBC: 167 ug/dL

## 2022-03-13 LAB — PREPARE RBC (CROSSMATCH)

## 2022-03-13 LAB — COMPREHENSIVE METABOLIC PANEL
ALT: 8 U/L (ref 0–44)
AST: 18 U/L (ref 15–41)
Albumin: 2.4 g/dL — ABNORMAL LOW (ref 3.5–5.0)
Alkaline Phosphatase: 63 U/L (ref 38–126)
Anion gap: 12 (ref 5–15)
BUN: 31 mg/dL — ABNORMAL HIGH (ref 6–20)
CO2: 19 mmol/L — ABNORMAL LOW (ref 22–32)
Calcium: 7.9 mg/dL — ABNORMAL LOW (ref 8.9–10.3)
Chloride: 105 mmol/L (ref 98–111)
Creatinine, Ser: 1.56 mg/dL — ABNORMAL HIGH (ref 0.61–1.24)
GFR, Estimated: 52 mL/min — ABNORMAL LOW (ref >60)
Glucose, Bld: 117 mg/dL — ABNORMAL HIGH (ref 70–99)
Potassium: 5.2 mmol/L — ABNORMAL HIGH (ref 3.5–5.1)
Sodium: 136 mmol/L (ref 135–145)
Total Bilirubin: 0.6 mg/dL (ref 0.3–1.2)
Total Protein: 5.9 g/dL — ABNORMAL LOW (ref 6.5–8.1)

## 2022-03-13 LAB — CBC
HCT: 18.9 % — ABNORMAL LOW (ref 39.0–52.0)
HCT: 17.2 % — ABNORMAL LOW (ref 39.0–52.0)
Hemoglobin: 5.5 g/dL — CL (ref 13.0–17.0)
Hemoglobin: 5.4 g/dL — CL (ref 13.0–17.0)
MCH: 29.6 pg (ref 26.0–34.0)
MCH: 30.9 pg (ref 26.0–34.0)
MCHC: 29.1 g/dL — ABNORMAL LOW (ref 30.0–36.0)
MCHC: 31.4 g/dL (ref 30.0–36.0)
MCV: 101.6 fL — ABNORMAL HIGH (ref 80.0–100.0)
MCV: 98.3 fL (ref 80.0–100.0)
Platelets: 407 10*3/uL — ABNORMAL HIGH (ref 150–400)
Platelets: 429 10*3/uL — ABNORMAL HIGH (ref 150–400)
RBC: 1.86 MIL/uL — ABNORMAL LOW (ref 4.22–5.81)
RBC: 1.75 MIL/uL — ABNORMAL LOW (ref 4.22–5.81)
RDW: 16.1 % — ABNORMAL HIGH (ref 11.5–15.5)
RDW: 16.1 % — ABNORMAL HIGH (ref 11.5–15.5)
WBC: 6.8 10*3/uL (ref 4.0–10.5)
WBC: 6.5 10*3/uL (ref 4.0–10.5)
nRBC: 0 % (ref 0.0–0.2)
nRBC: 0 % (ref 0.0–0.2)

## 2022-03-13 LAB — FERRITIN: Ferritin: 221 ng/mL (ref 24–336)

## 2022-03-13 LAB — FOLATE: Folate: 9.7 ng/mL (ref >5.9)

## 2022-03-13 LAB — VITAMIN B12: Vitamin B-12: 489 pg/mL (ref 180–914)

## 2022-03-13 MED ORDER — SODIUM CHLORIDE 0.9% IV SOLUTION: Freq: Once | INTRAVENOUS | Status: DC

## 2022-03-13 MED ORDER — HEPARIN SODIUM (PORCINE) 1000 UNIT/ML IJ SOLN
INTRAMUSCULAR | Status: AC
  Administered 2022-03-13: 3900 [IU]
  Filled 2022-03-13: qty 5

## 2022-03-13 MED ORDER — HYDROMORPHONE HCL 1 MG/ML IJ SOLN
1.0000 mg | INTRAMUSCULAR | Status: DC | PRN
  Administered 2022-03-13 – 2022-03-16 (×14): 1 mg via INTRAVENOUS
  Filled 2022-03-13 (×14): qty 1

## 2022-03-13 MED ORDER — HYDROMORPHONE HCL 1 MG/ML IJ SOLN
INTRAMUSCULAR | Status: AC
  Filled 2022-03-13: qty 0.5

## 2022-03-13 NOTE — Progress Notes (Signed)
       CROSS COVER NOTE  NAME: [name] MRN: [id number] DOB : Jul 24, 1965    HPI/Events of Note   Patient admitted for surgical intervention of bilateral BKA ischemia. Significant history for ESRD on HD, CHF and anemia. Baseline HGB 10 -11  Assessment and  Interventions   Assessment: Will have HD this am Low result confirmed with redraw test BP soft but MAPs above 65. HR low 100s Plan: Transfuse 2 units prbcs with HD this am       Kathlene Cote NP [facility]

## 2022-03-13 NOTE — Progress Notes (Signed)
  [facility] Progress Note   57 y.o. male seizures, PAD, COPD, chronic pain, HTN, AKI  MWF at [facility] with last dialysis treatment on Wednesday; started HD 12/27. 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 He had successful b/l AKA's by Dr. Stanford Breed and  in the PACU vitals were stable with the patient in discomfort. Of note his BUN/Cr was 24/1.44. He denies shortness of breath, fever, chills, nausea or chest pain.   Assessment/ Plan:   AKI - MWF @ [place] on dialysis since 02/03/22 Seen on HD 136/86 2L UF as tolerated  Will monitor UOP as well and if significant will perform a 24hr collection to determine if there's adequate recovery to come off HD. He's so malnourished and low muscle mass and cr will be low but may still have poor function.  Renal osteodystrophy - still need to check a phos for binder management Anemia - Hb 9; iron panel, ESA + iron as needed; transfuse if needed. PAD with distal ischemia s/p b/l AKA's successfully on 2/2. COPD Malnutrition - additional protein shakes qdaily; nepro BID  Subjective:   C/o pain in the legs but denied f/c/n/v/sob   Objective:   BP 121/86   Pulse (!) 104   Temp 98.2 F (36.8 C) (Oral)   Resp 16   Ht 5\' 10"  (1.778 m)   Wt 52 kg   SpO2 94%   BMI 16.45 kg/m   Intake/Output Summary (Last 24 hours) at 03/13/2022 1143 Last data filed at 03/13/2022 1113 Gross per 24 hour  Intake 814 ml  Output 100 ml  Net 714 ml   Weight change:   Physical Exam: General appearance: alert, cooperative, and appears stated age Head: NCAT Eyes: negative Resp: CTA b/l Cardio: RRR GI: soft, non-tender; bowel sounds normal; no masses,  no organomegaly Extremities: B/L AKA's with wound vacs in place Access: RIJ TC  Imaging: No results found.  Labs: BMET Recent Labs  Lab 03/12/22 1104 03/13/22 0258  NA 138 136  K 4.5 5.2*  CL 103 105  CO2 21* 19*   GLUCOSE 78 117*  BUN 22* 31*  CREATININE 1.44* 1.56*  CALCIUM 8.6* 7.9*   CBC Recent Labs  Lab 03/12/22 1104 03/13/22 0258 03/13/22 0414  WBC 5.8 6.8 6.5  HGB 9.0* 5.5* 5.4*  HCT 31.1* 18.9* 17.2*  MCV 101.3* 101.6* 98.3  PLT 475* 407* 429*    Medications:     sodium chloride   Intravenous Once   atorvastatin  40 mg Oral QHS   carvedilol  25 mg Oral BID WC   Chlorhexidine Gluconate Cloth  6 each Topical Q0600   DULoxetine  30 mg Oral BID   feeding supplement (NEPRO CARB STEADY)  237 mL Oral BID BM   gabapentin  400 mg Oral TID   isosorbide dinitrate  10 mg Oral TID   levETIRAcetam  500 mg Oral BID   pantoprazole  40 mg Oral Daily   sodium chloride flush  3 mL Intravenous Q12H   tamsulosin  0.4 mg Oral Daily      Otelia Santee, MD 03/13/2022, 11:43 AM

## 2022-03-13 NOTE — Progress Notes (Signed)
Date and time results received: 03/13/22 0414 (use smartphrase ".now" to insert current time)  Test: Hemoglobin Critical Value: 5.4  Name of Provider Notified: Sharion Settler, NP  Orders Received? Or Actions Taken?: Orders Received - See Orders for details

## 2022-03-13 NOTE — Plan of Care (Signed)
Patient is alert, oriented, and able to voice needs. Assistance provided with ADLs as requested. PRN pain medication given as ordered. Patient takes pills whole. Wife is bedside. Call bell within reach. Will continue to monitor.   Problem: Education: Goal: Knowledge of General Education information will improve Description: Including pain rating scale, medication(s)/side effects and non-pharmacologic comfort measures Outcome: Progressing   Problem: Health Behavior/Discharge Planning: Goal: Ability to manage health-related needs will improve Outcome: Progressing   Problem: Clinical Measurements: Goal: Ability to maintain clinical measurements within normal limits will improve Outcome: Progressing Goal: Will remain free from infection Outcome: Progressing Goal: Diagnostic test results will improve Outcome: Progressing Goal: Respiratory complications will improve Outcome: Progressing Goal: Cardiovascular complication will be avoided Outcome: Progressing   Problem: Activity: Goal: Risk for activity intolerance will decrease Outcome: Progressing   Problem: Nutrition: Goal: Adequate nutrition will be maintained Outcome: Progressing   Problem: Coping: Goal: Level of anxiety will decrease Outcome: Progressing   Problem: Elimination: Goal: Will not experience complications related to bowel motility Outcome: Progressing Goal: Will not experience complications related to urinary retention Outcome: Progressing   Problem: Pain Managment: Goal: General experience of comfort will improve Outcome: Progressing   Problem: Safety: Goal: Ability to remain free from injury will improve Outcome: Progressing   Problem: Skin Integrity: Goal: Risk for impaired skin integrity will decrease Outcome: Progressing

## 2022-03-13 NOTE — Progress Notes (Signed)
Received patient in bed to unit.  Alert and oriented.  Informed consent signed and in chart.   TX duration:  Patient tolerated well.  Transported back to the room  Alert, without acute distress.  Hand-off given to patient's nurse.   Access used: Catheter Access issues: lines reversed  Total UF removed: 2L Medication(s) given: Dilaudid .05mg  x2 Post HD VS: 123/97,99,94%9.4 Post HD weight: [facility]

## 2022-03-13 NOTE — Progress Notes (Signed)
PROGRESS NOTE    [name]  TGG:[id number] DOB: [birth year] DOA: 03/12/2022 PCP: Lorelee Market, MD  56/M with history of PAD, bilateral BKA, COPD, chronic pain, CHF, anemia, depression, GERD, hypertension, ESRD on HD MWF, neuropathy presented for surgical intervention of bilateral BKA ischemia. -known history of significant PAD and prior failed femoral bypass resulting in bilateral BKA's in the past.  Has been experiencing ischemia of his distal stump in the setting of distal aortic occlusion.  Was seen by vascular surgery and recommended bilateral AKA with counseling that his aortic disease may make it difficult for this to heal as well.  He was admitted 2/2 for surgical intervention and had successful bilateral AKA performed without complication -2/3, Hb dropped to 5.4, 2U PRBC transfusing  Subjective: Patient seen on dialysis, getting 2 units of PRBC  Assessment and Plan:  PAD Ischemia of bilateral BKA sites -Known history of PAD and terminal aorta occlusion.  History of failed femoral bypass leading to history of BKA bilaterally.  With ischemia of bilateral BKA -Underwent bilateral AKA 2/2 -Aspirin, Plavix on hold, resume this when okay with vascular surgery -Multimodal pain control, Dilaudid, Vicodin, gabapentin, Cymbalta -Per vascular surgery -PT eval tomorrow  Acute blood loss anemia -Transfusing 2 units of PRBC today, check anemia panel, CBC in a.m.   Chronic pain -On Norco at home, pain control as above - Continue home duloxetine, gabapentin   Seizure disorder - Continue home Keppra   ESRD on HD -Has tunneled HD catheter -Nephrology following, HD today   COPD - Continue as needed DuoNebs   GERD - Continue home PPI   CHF > Unclear if reduced or preserved ejection fraction as recent notes send both.  Unable to view echocardiogram from last year and available. > Volume managed by HD - Continue home carvedilol and Isordil   DVT prophylaxis:      SCDs  for now Code Status:              Full, discussed with patient Family Communication:   No family at bedside, discussed with wife earlier on [address] Disposition Plan: Home health Home health services  Consultants: Left above-knee amputation, right above-knee amputation 2/2 Dr. Stanford Breed   Procedures:   Antimicrobials:    Objective: Vitals:   03/13/22 0830 03/13/22 0900 03/13/22 0930 03/13/22 0948  BP: 113/70 112/73 105/71 104/70  Pulse: 100 99 (!) 101 97  Resp: 10 17 17 10   Temp:    98.2 F (36.8 C)  TempSrc:    Oral  SpO2: 94% 97% 94%   Weight:      Height:        Intake/Output Summary (Last 24 hours) at 03/13/2022 1037 Last data filed at 03/12/2022 1453 Gross per 24 hour  Intake 650 ml  Output 100 ml  Net 550 ml   Filed Weights   03/12/22 1107 03/13/22 0629  Weight: 47.6 kg 52 kg    Examination:  General exam: Chronically ill male sitting up in bed, seen on dialysis, AAOx3 HEENT: Right IJ HD catheter, blood infusing CVS: S1-S2, regular rhythm Lungs: Poor air movement bilaterally Abdomen: Soft, nontender, bowel sounds present Extremities: Bilateral AKA with wound VAC Psychiatry:  Mood & affect appropriate.     Data Reviewed:   CBC: Recent Labs  Lab 03/12/22 1104 03/13/22 0258 03/13/22 0414  WBC 5.8 6.8 6.5  HGB 9.0* 5.5* 5.4*  HCT 31.1* 18.9* 17.2*  MCV 101.3* 101.6* 98.3  PLT 475* 407* 703*   Basic Metabolic  Panel: Recent Labs  Lab 03/12/22 1104 03/13/22 0258  NA 138 136  K 4.5 5.2*  CL 103 105  CO2 21* 19*  GLUCOSE 78 117*  BUN 22* 31*  CREATININE 1.44* 1.56*  CALCIUM 8.6* 7.9*   GFR: Estimated Creatinine Clearance: 39.8 mL/min (A) (by C-G formula based on SCr of 1.56 mg/dL (H)). Liver Function Tests: Recent Labs  Lab 03/12/22 1104 03/13/22 0258  AST 19 18  ALT 10 8  ALKPHOS 76 63  BILITOT 0.3 0.6  PROT 6.4* 5.9*  ALBUMIN 2.2* 2.4*   No results for input(s): "LIPASE", "AMYLASE" in the last 168 hours. No results for input(s):  "AMMONIA" in the last 168 hours. Coagulation Profile: Recent Labs  Lab 03/12/22 1104  INR 1.2   Cardiac Enzymes: No results for input(s): "CKTOTAL", "CKMB", "CKMBINDEX", "TROPONINI" in the last 168 hours. BNP (last 3 results) No results for input(s): "PROBNP" in the last 8760 hours. HbA1C: No results for input(s): "HGBA1C" in the last 72 hours. CBG: No results for input(s): "GLUCAP" in the last 168 hours. Lipid Profile: No results for input(s): "CHOL", "HDL", "LDLCALC", "TRIG", "CHOLHDL", "LDLDIRECT" in the last 72 hours. Thyroid Function Tests: No results for input(s): "TSH", "T4TOTAL", "FREET4", "T3FREE", "THYROIDAB" in the last 72 hours. Anemia Panel: No results for input(s): "VITAMINB12", "FOLATE", "FERRITIN", "TIBC", "IRON", "RETICCTPCT" in the last 72 hours. Urine analysis: No results found for: "COLORURINE", "APPEARANCEUR", "LABSPEC", "PHURINE", "GLUCOSEU", "HGBUR", "BILIRUBINUR", "KETONESUR", "PROTEINUR", "UROBILINOGEN", "NITRITE", "LEUKOCYTESUR" Sepsis Labs: @LABRCNTIP (procalcitonin:4,lacticidven:4)  ) Recent Results (from the past 240 hour(s))  Surgical pcr screen     Status: None   Collection Time: 03/12/22 12:49 PM   Specimen: Nasal Mucosa; Nasal Swab  Result Value Ref Range Status   MRSA, PCR NEGATIVE NEGATIVE Final   Staphylococcus aureus NEGATIVE NEGATIVE Final    Comment: (NOTE) The Xpert SA Assay (FDA approved for NASAL specimens in patients 36 years of age and older), is one component of a comprehensive surveillance program. It is not intended to diagnose infection nor to guide or monitor treatment. Performed at [facility], [place] [address], [place], [place] [address]      Radiology Studies: No results found.   Scheduled Meds:  sodium chloride   Intravenous Once   atorvastatin  40 mg Oral QHS   carvedilol  25 mg Oral BID WC   Chlorhexidine Gluconate Cloth  6 each Topical Q0600   DULoxetine  30 mg Oral BID   feeding supplement (NEPRO CARB  STEADY)  237 mL Oral BID BM   gabapentin  400 mg Oral TID   isosorbide dinitrate  10 mg Oral TID   levETIRAcetam  500 mg Oral BID   pantoprazole  40 mg Oral Daily   sodium chloride flush  3 mL Intravenous Q12H   tamsulosin  0.4 mg Oral Daily   Continuous Infusions:   LOS: 1 day    Time spent: 32min    Domenic Polite, MD [facility]   03/13/2022, 10:37 AM

## 2022-03-13 NOTE — Plan of Care (Signed)
  Problem: Activity: Goal: Risk for activity intolerance will decrease Outcome: Progressing   Problem: Nutrition: Goal: Adequate nutrition will be maintained Outcome: Progressing   Problem: Pain Managment: Goal: General experience of comfort will improve Outcome: Progressing   

## 2022-03-14 DIAGNOSIS — T8789 Other complications of amputation stump: Secondary | ICD-10-CM | POA: Diagnosis not present

## 2022-03-14 DIAGNOSIS — I998 Other disorder of circulatory system: Secondary | ICD-10-CM | POA: Diagnosis not present

## 2022-03-14 LAB — TYPE AND SCREEN
ABO/RH(D): A NEG
Antibody Screen: NEGATIVE
Unit division: 0
Unit division: 0

## 2022-03-14 LAB — BASIC METABOLIC PANEL
Anion gap: 8 (ref 5–15)
BUN: 20 mg/dL (ref 6–20)
CO2: 28 mmol/L (ref 22–32)
Calcium: 7.7 mg/dL — ABNORMAL LOW (ref 8.9–10.3)
Chloride: 100 mmol/L (ref 98–111)
Creatinine, Ser: 1.54 mg/dL — ABNORMAL HIGH (ref 0.61–1.24)
GFR, Estimated: 53 mL/min — ABNORMAL LOW (ref >60)
Glucose, Bld: 116 mg/dL — ABNORMAL HIGH (ref 70–99)
Potassium: 3.5 mmol/L (ref 3.5–5.1)
Sodium: 136 mmol/L (ref 135–145)

## 2022-03-14 LAB — CBC
HCT: 24.4 % — ABNORMAL LOW (ref 39.0–52.0)
Hemoglobin: 8.2 g/dL — ABNORMAL LOW (ref 13.0–17.0)
MCH: 29.8 pg (ref 26.0–34.0)
MCHC: 33.6 g/dL (ref 30.0–36.0)
MCV: 88.7 fL (ref 80.0–100.0)
Platelets: 275 10*3/uL (ref 150–400)
RBC: 2.75 MIL/uL — ABNORMAL LOW (ref 4.22–5.81)
RDW: 18.4 % — ABNORMAL HIGH (ref 11.5–15.5)
WBC: 5.7 10*3/uL (ref 4.0–10.5)
nRBC: 0 % (ref 0.0–0.2)

## 2022-03-14 LAB — BPAM RBC
Blood Product Expiration Date: 202402232359
Blood Product Expiration Date: 202402232359
ISSUE DATE / TIME: 202402030953
ISSUE DATE / TIME: 202402030953
Unit Type and Rh: 600
Unit Type and Rh: 600

## 2022-03-14 LAB — HEPATITIS B SURFACE ANTIBODY, QUANTITATIVE: Hep B S AB Quant (Post): <3.1 m[IU]/mL — ABNORMAL LOW (ref >9.9)

## 2022-03-14 MED ORDER — POLYETHYLENE GLYCOL 3350 17 G PO PACK
17.0000 g | PACK | Freq: Every day | ORAL | Status: DC
  Administered 2022-03-14 – 2022-03-16 (×3): 17 g via ORAL
  Filled 2022-03-14 (×3): qty 1

## 2022-03-14 MED ORDER — SENNOSIDES-DOCUSATE SODIUM 8.6-50 MG PO TABS
1.0000 | ORAL_TABLET | Freq: Two times a day (BID) | ORAL | Status: DC
  Administered 2022-03-14 – 2022-03-16 (×5): 1 via ORAL
  Filled 2022-03-14 (×5): qty 1

## 2022-03-14 MED ORDER — HYDROCODONE-ACETAMINOPHEN 5-325 MG PO TABS
2.0000 | ORAL_TABLET | Freq: Four times a day (QID) | ORAL | Status: DC | PRN
  Administered 2022-03-14 – 2022-03-15 (×3): 2 via ORAL
  Filled 2022-03-14 (×4): qty 2

## 2022-03-14 MED ORDER — ALPRAZOLAM 0.5 MG PO TABS
0.5000 mg | ORAL_TABLET | Freq: Two times a day (BID) | ORAL | Status: DC | PRN
  Administered 2022-03-14 – 2022-03-15 (×3): 0.5 mg via ORAL
  Filled 2022-03-14 (×3): qty 1

## 2022-03-14 MED ORDER — SODIUM CHLORIDE 0.9 % IV SOLN
250.0000 mg | Freq: Every day | INTRAVENOUS | Status: AC
  Administered 2022-03-14 – 2022-03-15 (×2): 250 mg via INTRAVENOUS
  Filled 2022-03-14 (×2): qty 20

## 2022-03-14 NOTE — Progress Notes (Signed)
  Daily Progress Note  S/p: Bilateral AKA  Subjective: Doing well this morning. Pain control good.   Objective: Vitals:   03/14/22 0533 03/14/22 0902  BP: 134/88 130/80  Pulse: 95 (!) 101  Resp: 19   Temp: 98.9 F (37.2 C) 98.9 F (37.2 C)  SpO2: 97% 98%    Physical Examination Dressings dry VAC to suction Smiling - good affect. Grateful.   ASSESSMENT/PLAN:  S/p bilateral AKA.  Hbg responded appropriately.  Will continue to follow PT/OT Appreciate hospital medicine's care.     Cassandria Santee MD [facility] [phone] 03/14/2022  12:54 PM

## 2022-03-14 NOTE — Plan of Care (Signed)

## 2022-03-14 NOTE — Progress Notes (Signed)
PROGRESS NOTE    [name]  MGQ:[id number] DOB: [birth year] DOA: 03/12/2022 PCP: Lorelee Market, MD  57/M with history of PAD, bilateral BKA, COPD, chronic pain, CHF, anemia, depression, GERD, hypertension, ESRD on HD MWF, neuropathy presented for surgical intervention of bilateral BKA ischemia. -known history of significant PAD and prior failed femoral bypass resulting in bilateral BKA's in the past.  Has been experiencing ischemia of his distal stump in the setting of distal aortic occlusion.  Was seen by vascular surgery and recommended bilateral AKA with counseling that his aortic disease may make it difficult for this to heal as well.  He was admitted 2/2 for surgical intervention and had successful bilateral AKA performed without complication -2/3, Hb dropped to 5.4, 2U PRBC transfusing  Subjective: -Feels better today, pain better controlled, got 2 units of blood yesterday  Assessment and Plan:  PAD Ischemia of bilateral BKA sites -Known history of PAD and terminal aorta occlusion.  History of failed femoral bypass leading to history of BKA bilaterally.  With ischemia of bilateral BKA -Underwent bilateral AKA 2/2 -Aspirin, Plavix on hold, resume tomorrow if okay with VVS -Multimodal pain control, Dilaudid, Vicodin, gabapentin, Cymbalta -Per vascular surgery -PT eval, patient declines rehab  Acute blood loss anemia -Transfused 2 units of PRBC yesterday, anemia panel with iron deficiency, add IV iron today -Hemoglobin improved, CBC in a.m.   Chronic pain -On Norco at home, pain control as above - Continue home duloxetine, gabapentin -Add laxatives   Seizure disorder - Continue home Keppra   ESRD on HD -Has tunneled HD catheter -Nephrology following, dialyzed yesterday   COPD - Continue as needed DuoNebs   GERD - Continue home PPI   CHF > Unclear if reduced or preserved ejection fraction as recent notes send both.  Unable to view echocardiogram from last  year and available. > Volume managed by HD - Continue home carvedilol and Isordil   DVT prophylaxis:      SCDs for now Code Status:              Full, discussed with patient Family Communication:   No family at bedside Disposition Plan: [facility]  Consultants: Left above-knee amputation, right above-knee amputation 2/2 Dr. Stanford Breed   Procedures:   Antimicrobials:    Objective: Vitals:   03/13/22 2030 03/14/22 0452 03/14/22 0533 03/14/22 0902  BP: 108/70 (!) 145/84 134/88 130/80  Pulse: 83  95 (!) 101  Resp: 17  19   Temp: 98.4 F (36.9 C)  98.9 F (37.2 C) 98.9 F (37.2 C)  TempSrc: Oral Oral Oral   SpO2: 93% 96% 97% 98%  Weight:      Height:        Intake/Output Summary (Last 24 hours) at 03/14/2022 1114 Last data filed at 03/13/2022 2100 Gross per 24 hour  Intake 390 ml  Output 2000 ml  Net -1610 ml   Filed Weights   03/12/22 1107 03/13/22 0629  Weight: 47.6 kg 52 kg    Examination:  General exam: Chronically ill male sitting up in bed, AAOx3 HEENT: Right IJ HD catheter  CVS: S1-S2, regular rhythm Lungs: poor air movement bilaterally Abdomen: Soft, mildly distended, nontender, bowel sounds present Extremities: AKA, wound VAC noted Psychiatry:  Mood & affect appropriate.     Data Reviewed:   CBC: Recent Labs  Lab 03/12/22 1104 03/13/22 0258 03/13/22 0414 03/14/22 0317  WBC 5.8 6.8 6.5 5.7  HGB 9.0* 5.5* 5.4* 8.2*  HCT 31.1* 18.9*  17.2* 24.4*  MCV 101.3* 101.6* 98.3 88.7  PLT 475* 407* 429* 425   Basic Metabolic Panel: Recent Labs  Lab 03/12/22 1104 03/13/22 0258 03/14/22 0317  NA 138 136 136  K 4.5 5.2* 3.5  CL 103 105 100  CO2 21* 19* 28  GLUCOSE 78 117* 116*  BUN 22* 31* 20  CREATININE 1.44* 1.56* 1.54*  CALCIUM 8.6* 7.9* 7.7*   GFR: Estimated Creatinine Clearance: 40.3 mL/min (A) (by C-G formula based on SCr of 1.54 mg/dL (H)). Liver Function Tests: Recent Labs  Lab 03/12/22 1104 03/13/22 0258  AST 19  18  ALT 10 8  ALKPHOS 76 63  BILITOT 0.3 0.6  PROT 6.4* 5.9*  ALBUMIN 2.2* 2.4*   No results for input(s): "LIPASE", "AMYLASE" in the last 168 hours. No results for input(s): "AMMONIA" in the last 168 hours. Coagulation Profile: Recent Labs  Lab 03/12/22 1104  INR 1.2   Cardiac Enzymes: No results for input(s): "CKTOTAL", "CKMB", "CKMBINDEX", "TROPONINI" in the last 168 hours. BNP (last 3 results) No results for input(s): "PROBNP" in the last 8760 hours. HbA1C: No results for input(s): "HGBA1C" in the last 72 hours. CBG: No results for input(s): "GLUCAP" in the last 168 hours. Lipid Profile: No results for input(s): "CHOL", "HDL", "LDLCALC", "TRIG", "CHOLHDL", "LDLDIRECT" in the last 72 hours. Thyroid Function Tests: No results for input(s): "TSH", "T4TOTAL", "FREET4", "T3FREE", "THYROIDAB" in the last 72 hours. Anemia Panel: Recent Labs    03/13/22 0800  VITAMINB12 489  FOLATE 9.7  FERRITIN 221  TIBC 186*  IRON 19*  RETICCTPCT 6.0*   Urine analysis: No results found for: "COLORURINE", "APPEARANCEUR", "LABSPEC", "PHURINE", "GLUCOSEU", "HGBUR", "BILIRUBINUR", "KETONESUR", "PROTEINUR", "UROBILINOGEN", "NITRITE", "LEUKOCYTESUR" Sepsis Labs: @LABRCNTIP (procalcitonin:4,lacticidven:4)  ) Recent Results (from the past 240 hour(s))  Surgical pcr screen     Status: None   Collection Time: 03/12/22 12:49 PM   Specimen: Nasal Mucosa; Nasal Swab  Result Value Ref Range Status   MRSA, PCR NEGATIVE NEGATIVE Final   Staphylococcus aureus NEGATIVE NEGATIVE Final    Comment: (NOTE) The Xpert SA Assay (FDA approved for NASAL specimens in patients 19 years of age and older), is one component of a comprehensive surveillance program. It is not intended to diagnose infection nor to guide or monitor treatment. Performed at [facility], [place] [address] [place], [place] [address]      Radiology Studies: No results found.   Scheduled Meds:  sodium chloride   Intravenous  Once   atorvastatin  40 mg Oral QHS   carvedilol  25 mg Oral BID WC   Chlorhexidine Gluconate Cloth  6 each Topical Q0600   DULoxetine  30 mg Oral BID   feeding supplement (NEPRO CARB STEADY)  237 mL Oral BID BM   gabapentin  400 mg Oral TID   isosorbide dinitrate  10 mg Oral TID   levETIRAcetam  500 mg Oral BID   pantoprazole  40 mg Oral Daily   polyethylene glycol  17 g Oral Daily   senna-docusate  1 tablet Oral BID   sodium chloride flush  3 mL Intravenous Q12H   tamsulosin  0.4 mg Oral Daily   Continuous Infusions:  ferric gluconate (FERRLECIT) IVPB       LOS: 2 days    Time spent: 48min    Domenic Polite, MD [facility]   03/14/2022, 11:14 AM

## 2022-03-14 NOTE — Progress Notes (Signed)
  [facility] Progress Note   57 y.o. male seizures, PAD, COPD, chronic pain, HTN, AKI  MWF at [place] with last dialysis treatment on Wednesday; started HD 12/27. 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 He had successful b/l AKA's by Dr. Stanford Breed and  in the PACU vitals were stable with the patient in discomfort. Of note his BUN/Cr was 24/1.44. He denies shortness of breath, fever, chills, nausea or chest pain.    Assessment/ Plan:   AKI - MWF @ [place] on dialysis since 02/03/22 Tolerated HD  on 2/3 w/ 2L UF  UOP not recorded but I spoke with the spouse and she emptied ~400 ml this am.  Strict I&O's, will monitor UOP; will perform a 24hr collection to determine if there's adequate recovery to come off HD. Possible that he's so malnourished with little muscle mass that a lower cr represents very little residual function.  Renal osteodystrophy - still need to check a phos for binder management Anemia - Hb 9; iron panel, ESA + iron as needed; transfuse if needed. PAD with distal ischemia s/p b/l AKA's successfully on 2/2. COPD Malnutrition - additional protein shakes qdaily; nepro BID  Subjective:   C/o pain in the legs but denied f/c/n/v/sob; much more comfortable today. Spouse bedside and updated.    Objective:   BP 134/88 (BP Location: Left Arm)   Pulse 95   Temp 98.9 F (37.2 C) (Oral)   Resp 19   Ht 5\' 10"  (1.778 m)   Wt 52 kg   SpO2 97%   BMI 16.45 kg/m   Intake/Output Summary (Last 24 hours) at 03/14/2022 7209 Last data filed at 03/13/2022 2100 Gross per 24 hour  Intake 554 ml  Output 2000 ml  Net -1446 ml   Weight change:   Physical Exam: General appearance: alert, cooperative, and appears stated age Head: NCAT Eyes: negative Resp: CTA b/l Cardio: RRR GI: soft, non-tender; bowel sounds normal; no masses,  no organomegaly Extremities: B/L AKA's  Access: RIJ  TC  Imaging: No results found.  Labs: BMET Recent Labs  Lab 03/12/22 1104 03/13/22 0258 03/14/22 0317  NA 138 136 136  K 4.5 5.2* 3.5  CL 103 105 100  CO2 21* 19* 28  GLUCOSE 78 117* 116*  BUN 22* 31* 20  CREATININE 1.44* 1.56* 1.54*  CALCIUM 8.6* 7.9* 7.7*   CBC Recent Labs  Lab 03/12/22 1104 03/13/22 0258 03/13/22 0414 03/14/22 0317  WBC 5.8 6.8 6.5 5.7  HGB 9.0* 5.5* 5.4* 8.2*  HCT 31.1* 18.9* 17.2* 24.4*  MCV 101.3* 101.6* 98.3 88.7  PLT 475* 407* 429* 275    Medications:     sodium chloride   Intravenous Once   atorvastatin  40 mg Oral QHS   carvedilol  25 mg Oral BID WC   Chlorhexidine Gluconate Cloth  6 each Topical Q0600   DULoxetine  30 mg Oral BID   feeding supplement (NEPRO CARB STEADY)  237 mL Oral BID BM   gabapentin  400 mg Oral TID   isosorbide dinitrate  10 mg Oral TID   levETIRAcetam  500 mg Oral BID   pantoprazole  40 mg Oral Daily   sodium chloride flush  3 mL Intravenous Q12H   tamsulosin  0.4 mg Oral Daily      Otelia Santee, MD 03/14/2022, 7:48 AM

## 2022-03-14 NOTE — Plan of Care (Signed)
  Problem: Education: ?Goal: Knowledge of General Education information will improve ?Description: Including pain rating scale, medication(s)/side effects and non-pharmacologic comfort measures ?Outcome: Progressing ?  ?Problem: Health Behavior/Discharge Planning: ?Goal: Ability to manage health-related needs will improve ?Outcome: Progressing ?  ?Problem: Clinical Measurements: ?Goal: Ability to maintain clinical measurements within normal limits will improve ?Outcome: Progressing ?Goal: Will remain free from infection ?Outcome: Progressing ?Goal: Diagnostic test results will improve ?Outcome: Progressing ?Goal: Respiratory complications will improve ?Outcome: Progressing ?Goal: Cardiovascular complication will be avoided ?Outcome: Progressing ?  ?Problem: Nutrition: ?Goal: Adequate nutrition will be maintained ?Outcome: Progressing ?  ?Problem: Coping: ?Goal: Level of anxiety will decrease ?Outcome: Progressing ?  ?Problem: Elimination: ?Goal: Will not experience complications related to bowel motility ?Outcome: Progressing ?Goal: Will not experience complications related to urinary retention ?Outcome: Progressing ?  ?Problem: Safety: ?Goal: Ability to remain free from injury will improve ?Outcome: Progressing ?  ?Problem: Skin Integrity: ?Goal: Risk for impaired skin integrity will decrease ?Outcome: Progressing ?  ?Problem: Activity: ?Goal: Risk for activity intolerance will decrease ?Outcome: Not Progressing ?  ?Problem: Pain Managment: ?Goal: General experience of comfort will improve ?Outcome: Not Progressing ?  ?

## 2022-03-14 NOTE — Evaluation (Signed)
Physical Therapy Evaluation Patient Details Name: [name] MRN: [id number] DOB: Jun 23, 1965 Today's Date: 03/14/2022  History of Present Illness  Pt is a 57 y.o. male s/p B AKA 03/12/22 due to ischemic B BKA.  He had a recent hospitalization (Dec 2023) in [place] with rhabdomyolysis and worsening renal failure.  PMH: seizures, PAD with h/o B BKAs (~19 yeas ago), COPD, chronic pain, CHF, anemia, depression, GERD, hypertension, ESRD on HD MWF, neuropathy   Clinical Impression  Pt admitted with above diagnosis. PTA pt lived at home with wife, mod I at w/c level. Pt currently with functional limitations due to the deficits listed below (see PT Problem List). On eval, he required min assist bed mobility and demonstrated poor sitting balance EOB. He has excellent UE and core strength. He should progress quickly with mobility as he acclimates to new residual limb length and new center of gravity. Pt will benefit from skilled PT to increase their independence and safety with mobility to allow discharge to the venue listed below. Initially recommending HHPT with hopeful plan of transitioning to OPPT and fitting for custom wheelchair.         Recommendations for follow up therapy are one component of a multi-disciplinary discharge planning process, led by the attending physician.  Recommendations may be updated based on patient status, additional functional criteria and insurance authorization.  Follow Up Recommendations Home health PT      Assistance Recommended at Discharge PRN  Patient can return home with the following  A little help with walking and/or transfers;Assistance with cooking/housework;Assist for transportation;A little help with bathing/dressing/bathroom;Help with stairs or ramp for entrance    Equipment Recommendations Other (comment) (sliding board, tub bench)  Recommendations for Other Services       Functional Status Assessment Patient has had a recent decline in their  functional status and demonstrates the ability to make significant improvements in function in a reasonable and predictable amount of time.     Precautions / Restrictions Precautions Precautions: Fall;Other (comment) Precaution Comments: wound vacs B residual limbs      Mobility  Bed Mobility Overal bed mobility: Needs Assistance Bed Mobility: Supine to Sit     Supine to sit: Min assist     General bed mobility comments: Pt with excellent UE strength and trunk control. Assist provided to anterior chest/trunk to help maintain balance and prevent forward LOB as pt acclimates to new residual limb length and new center of gravity    Transfers                   General transfer comment: deferred. Next session to progress with slide board vs A/P transfers    Ambulation/Gait               General Gait Details: nonamb at baseline  Stairs            Wheelchair Mobility    Modified Rankin (Stroke Patients Only)       Balance Overall balance assessment: Needs assistance Sitting-balance support: Bilateral upper extremity supported Sitting balance-Leahy Scale: Poor Sitting balance - Comments: heavy reliance on BUE sitting EOB and min assist                                     Pertinent Vitals/Pain Pain Assessment Pain Assessment: 0-10 Pain Score: 4  Pain Location: B residual limbs Pain Descriptors / Indicators: Discomfort, Sore Pain Intervention(s):  Monitored during session, Repositioned, Premedicated before session    [place] expects to be discharged to:: Private residence Living Arrangements: Spouse/significant other;Parent Available Help at Discharge: Family;Available 24 hours/day Type of Home: House Home Access: Stairs to enter;Ramped entrance Entrance Stairs-Rails: None Entrance Stairs-Number of Steps: ramp, then step onto porch, and then threshold into house   Home Layout: One level Home Equipment: Shower  seat;Wheelchair - manual Additional Comments: Pt is need of new w/c. He would benefit from wheelchair rep for custom fit wheelchair.    Prior Function Prior Level of Function : Independent/Modified Independent             Mobility Comments: mod I at w/c level. Only needed help putting w/c in/out of car. Pt did not wear LE prosthesis.       Hand Dominance        Extremity/Trunk Assessment   Upper Extremity Assessment Upper Extremity Assessment: Overall WFL for tasks assessed    Lower Extremity Assessment Lower Extremity Assessment: LLE deficits/detail;RLE deficits/detail RLE Deficits / Details: s/p BKA to AKA, wound vac in place LLE Deficits / Details: s/p BKA to AKA, wound vac in place       Communication   Communication: No difficulties  Cognition Arousal/Alertness: Awake/alert Behavior During Therapy: WFL for tasks assessed/performed Overall Cognitive Status: Within Functional Limits for tasks assessed                                          General Comments      Exercises     Assessment/Plan    PT Assessment Patient needs continued PT services  PT Problem List Decreased balance;Pain;Decreased mobility;Decreased activity tolerance       PT Treatment Interventions DME instruction;Functional mobility training;Balance training;Patient/family education;Therapeutic activities;Neuromuscular re-education;Therapeutic exercise    PT Goals (Current goals can be found in the Care Plan section)  Acute Rehab PT Goals Patient Stated Goal: home PT Goal Formulation: With patient/family Time For Goal Achievement: 03/28/22 Potential to Achieve Goals: Good    Frequency Min 3X/week     Co-evaluation               AM-PAC PT "6 Clicks" Mobility  Outcome Measure Help needed turning from your back to your side while in a flat bed without using bedrails?: A Little Help needed moving from lying on your back to sitting on the side of a flat bed  without using bedrails?: A Little Help needed moving to and from a bed to a chair (including a wheelchair)?: A Lot Help needed standing up from a chair using your arms (e.g., wheelchair or bedside chair)?: Total Help needed to walk in hospital room?: Total Help needed climbing 3-5 steps with a railing? : Total 6 Click Score: 11    End of Session   Activity Tolerance: Patient tolerated treatment well Patient left: in bed;with call bell/phone within reach;with family/visitor present Nurse Communication: Mobility status PT Visit Diagnosis: Other abnormalities of gait and mobility (R26.89);Pain    Time: 5400-8676 PT Time Calculation (min) (ACUTE ONLY): 33 min   Charges:   PT Evaluation $PT Eval Moderate Complexity: 1 Mod PT Treatments $Therapeutic Activity: 8-22 mins        Gloriann Loan., PT  Office # [phone]   Lorriane Shire 03/14/2022, 2:09 PM

## 2022-03-15 DIAGNOSIS — T8789 Other complications of amputation stump: Secondary | ICD-10-CM | POA: Diagnosis not present

## 2022-03-15 DIAGNOSIS — I998 Other disorder of circulatory system: Secondary | ICD-10-CM | POA: Diagnosis not present

## 2022-03-15 LAB — BASIC METABOLIC PANEL
Anion gap: 10 (ref 5–15)
BUN: 22 mg/dL — ABNORMAL HIGH (ref 6–20)
CO2: 26 mmol/L (ref 22–32)
Calcium: 7.9 mg/dL — ABNORMAL LOW (ref 8.9–10.3)
Chloride: 101 mmol/L (ref 98–111)
Creatinine, Ser: 1.29 mg/dL — ABNORMAL HIGH (ref 0.61–1.24)
GFR, Estimated: >60 mL/min (ref >60)
Glucose, Bld: 109 mg/dL — ABNORMAL HIGH (ref 70–99)
Potassium: 3.3 mmol/L — ABNORMAL LOW (ref 3.5–5.1)
Sodium: 137 mmol/L (ref 135–145)

## 2022-03-15 LAB — CBC
HCT: 24.7 % — ABNORMAL LOW (ref 39.0–52.0)
Hemoglobin: 8 g/dL — ABNORMAL LOW (ref 13.0–17.0)
MCH: 29.5 pg (ref 26.0–34.0)
MCHC: 32.4 g/dL (ref 30.0–36.0)
MCV: 91.1 fL (ref 80.0–100.0)
Platelets: 282 10*3/uL (ref 150–400)
RBC: 2.71 MIL/uL — ABNORMAL LOW (ref 4.22–5.81)
RDW: 17.2 % — ABNORMAL HIGH (ref 11.5–15.5)
WBC: 5.5 10*3/uL (ref 4.0–10.5)
nRBC: 0 % (ref 0.0–0.2)

## 2022-03-15 LAB — SURGICAL PATHOLOGY

## 2022-03-15 MED ORDER — CLOPIDOGREL BISULFATE 75 MG PO TABS
75.0000 mg | ORAL_TABLET | Freq: Every day | ORAL | Status: DC
  Administered 2022-03-15 – 2022-03-16 (×2): 75 mg via ORAL
  Filled 2022-03-15 (×2): qty 1

## 2022-03-15 MED ORDER — ASPIRIN 81 MG PO CHEW
81.0000 mg | CHEWABLE_TABLET | Freq: Every day | ORAL | Status: DC
  Administered 2022-03-15 – 2022-03-16 (×2): 81 mg via ORAL
  Filled 2022-03-15 (×2): qty 1

## 2022-03-15 MED ORDER — POTASSIUM CHLORIDE CRYS ER 20 MEQ PO TBCR
40.0000 meq | EXTENDED_RELEASE_TABLET | Freq: Once | ORAL | Status: AC
  Administered 2022-03-15: 40 meq via ORAL
  Filled 2022-03-15: qty 2

## 2022-03-15 NOTE — Progress Notes (Signed)
PT Cancellation Note  Patient Details Name: [name] MRN: [id number] DOB: 19-Aug-1965   Cancelled Treatment:    Reason Eval/Treat Not Completed: Patient declined, no reason specified. Pt declines need for transfer training with sliding board, having prior experience with sliding board transfers after BKA. PT reiterates recommendation to go to [place] wheelchair seating clinic to get the most suitable wheelchair cushion after bilateral AKA. Pt denies further questions at this time. PT will continue to follow until discharge is complete.   Zenaida Niece 03/15/2022, 2:28 PM

## 2022-03-15 NOTE — Progress Notes (Addendum)
[facility]  Subjective  - No new complaints   Objective (!) 122/91 92 98.6 F (37 C) (Oral) 17 97%  Intake/Output Summary (Last 24 hours) at 03/15/2022 0741 Last data filed at 03/15/2022 0616 Gross per 24 hour  Intake 390 ml  Output 700 ml  Net -310 ml    B AKA with incisional vacs to suction.  Good seal.  No drainage Skin warm B AKA thighs Lungs non labored breathing  Assessment/Planning: POD # 3  Revision B BKA to B AKA Will maintain vacs until discharge PT recommending [place] I recommend a new seat cushion to accommodate AKA balance changes with sitting will consult PT for recommendations. Face to face [facility] ordered. Will D/C vacs prior to discharge   Roxy Horseman 03/15/2022 7:41 AM --  Laboratory Lab Results: Recent Labs    03/14/22 0317 03/15/22 0258  WBC 5.7 5.5  HGB 8.2* 8.0*  HCT 24.4* 24.7*  PLT 275 282   BMET Recent Labs    03/14/22 0317 03/15/22 0258  NA 136 137  K 3.5 3.3*  CL 100 101  CO2 28 26  GLUCOSE 116* 109*  BUN 20 22*  CREATININE 1.54* 1.29*  CALCIUM 7.7* 7.9*    COAG Lab Results  Component Value Date   INR 1.2 03/12/2022   No results found for: "PTT"  VASCULAR STAFF ADDENDUM: I have independently interviewed and examined the patient. I agree with the above.   Yevonne Aline. Stanford Breed, MD [facility] [place] [facility] Phone Number: [phone] 03/15/2022 10:28 AM

## 2022-03-15 NOTE — Progress Notes (Signed)
PROGRESS NOTE    [name]  HCW:[id number] DOB: 1965/03/29 DOA: 03/12/2022 PCP: Lorelee Market, MD  56/M with history of PAD, bilateral BKA, COPD, chronic pain, CHF, anemia, depression, GERD, hypertension, ESRD on HD MWF, neuropathy presented for surgical intervention of bilateral BKA ischemia. -known history of significant PAD and prior failed femoral bypass resulting in bilateral BKA's in the past.  Has been experiencing ischemia of his distal stump in the setting of distal aortic occlusion.  Was seen by vascular surgery and recommended bilateral AKA with counseling that his aortic disease may make it difficult for this to heal as well.  He was admitted 2/2 for surgical intervention and had successful bilateral AKA performed without complication -2/3, Hb dropped to 5.4, 2U PRBC transfusing  Subjective: -Feels better today, pain better controlled, got 2 units of blood yesterday  Assessment and Plan:  PAD Ischemia of bilateral BKA sites -Known history of PAD and terminal aorta occlusion.  History of failed femoral bypass leading to history of BKA bilaterally.  With ischemia of bilateral BKA -Underwent bilateral AKA 2/2 -Resume aspirin and Plavix -Multimodal pain control, Dilaudid, Vicodin, gabapentin, Cymbalta -Remains in wound VAC, per vascular surgery -PT eval, patient declines rehab, home with home health services in 1 to 2 days  Acute blood loss anemia -Transfused 2 units of PRBC, anemia panel with iron deficiency, continue IV iron aspirin Plavix -Hemoglobin improved, CBC in a.m.   Chronic pain -On Norco at home, pain control as above - Continue home duloxetine, gabapentin -Add laxatives   Seizure disorder - Continue home Keppra   ESRD on HD -Has tunneled HD catheter -Nephrology following, dialyzed yesterday   COPD - Continue as needed DuoNebs   GERD - Continue home PPI   CHF > Unclear if reduced or preserved ejection fraction as recent notes send both.   Unable to view echocardiogram from last year and available. > Volume managed by HD - Continue home carvedilol and Isordil   DVT prophylaxis:      SCDs for now Code Status:              Full, discussed with patient Family Communication:   Wife at bedside Disposition Plan: Home with home health services tomorrow if stable  Consultants: Left above-knee amputation, right above-knee amputation 2/2 Dr. Stanford Breed   Procedures:   Antimicrobials:    Objective: Vitals:   03/14/22 1645 03/14/22 2105 03/15/22 0552 03/15/22 0802  BP: (!) 106/54 107/65 (!) 122/91 135/87  Pulse: 80 83 92 97  Resp: 20 16 17    Temp: 98.5 F (36.9 C) 98.2 F (36.8 C) 98.6 F (37 C) (!) 97.3 F (36.3 C)  TempSrc: Oral Oral Oral Oral  SpO2: 95% 93% 97% 95%  Weight:   52.4 kg   Height:        Intake/Output Summary (Last 24 hours) at 03/15/2022 1112 Last data filed at 03/15/2022 1761 Gross per 24 hour  Intake 390 ml  Output 700 ml  Net -310 ml   Filed Weights   03/12/22 1107 03/13/22 0629 03/15/22 0552  Weight: 47.6 kg 52 kg 52.4 kg    Examination:  General exam: Chronically ill male sitting up in bed, AAOx3 HEENT: Right IJ HD catheter CVS: S1-S2, regular rhythm Lungs: Poor air movement bilaterally Abdomen: Soft, nontender, bowel sounds present Extremities: Bilateral AKA with wound VAC Psychiatry:  Mood & affect appropriate.     Data Reviewed:   CBC: Recent Labs  Lab 03/12/22 1104 03/13/22 0258 03/13/22 0414 03/14/22  8413 03/15/22 0258  WBC 5.8 6.8 6.5 5.7 5.5  HGB 9.0* 5.5* 5.4* 8.2* 8.0*  HCT 31.1* 18.9* 17.2* 24.4* 24.7*  MCV 101.3* 101.6* 98.3 88.7 91.1  PLT 475* 407* 429* 275 244   Basic Metabolic Panel: Recent Labs  Lab 03/12/22 1104 03/13/22 0258 03/14/22 0317 03/15/22 0258  NA 138 136 136 137  K 4.5 5.2* 3.5 3.3*  CL 103 105 100 101  CO2 21* 19* 28 26  GLUCOSE 78 117* 116* 109*  BUN 22* 31* 20 22*  CREATININE 1.44* 1.56* 1.54* 1.29*  CALCIUM 8.6* 7.9* 7.7* 7.9*    GFR: Estimated Creatinine Clearance: 48.5 mL/min (A) (by C-G formula based on SCr of 1.29 mg/dL (H)). Liver Function Tests: Recent Labs  Lab 03/12/22 1104 03/13/22 0258  AST 19 18  ALT 10 8  ALKPHOS 76 63  BILITOT 0.3 0.6  PROT 6.4* 5.9*  ALBUMIN 2.2* 2.4*   No results for input(s): "LIPASE", "AMYLASE" in the last 168 hours. No results for input(s): "AMMONIA" in the last 168 hours. Coagulation Profile: Recent Labs  Lab 03/12/22 1104  INR 1.2   Cardiac Enzymes: No results for input(s): "CKTOTAL", "CKMB", "CKMBINDEX", "TROPONINI" in the last 168 hours. BNP (last 3 results) No results for input(s): "PROBNP" in the last 8760 hours. HbA1C: No results for input(s): "HGBA1C" in the last 72 hours. CBG: No results for input(s): "GLUCAP" in the last 168 hours. Lipid Profile: No results for input(s): "CHOL", "HDL", "LDLCALC", "TRIG", "CHOLHDL", "LDLDIRECT" in the last 72 hours. Thyroid Function Tests: No results for input(s): "TSH", "T4TOTAL", "FREET4", "T3FREE", "THYROIDAB" in the last 72 hours. Anemia Panel: Recent Labs    03/13/22 0800  VITAMINB12 489  FOLATE 9.7  FERRITIN 221  TIBC 186*  IRON 19*  RETICCTPCT 6.0*   Urine analysis: No results found for: "COLORURINE", "APPEARANCEUR", "LABSPEC", "PHURINE", "GLUCOSEU", "HGBUR", "BILIRUBINUR", "KETONESUR", "PROTEINUR", "UROBILINOGEN", "NITRITE", "LEUKOCYTESUR" Sepsis Labs: @LABRCNTIP (procalcitonin:4,lacticidven:4)  ) Recent Results (from the past 240 hour(s))  Surgical pcr screen     Status: None   Collection Time: 03/12/22 12:49 PM   Specimen: Nasal Mucosa; Nasal Swab  Result Value Ref Range Status   MRSA, PCR NEGATIVE NEGATIVE Final   Staphylococcus aureus NEGATIVE NEGATIVE Final    Comment: (NOTE) The Xpert SA Assay (FDA approved for NASAL specimens in patients 93 years of age and older), is one component of a comprehensive surveillance program. It is not intended to diagnose infection nor to guide or monitor  treatment. Performed at [facility], [place] [address], [place], [place] [address]      Radiology Studies: No results found.   Scheduled Meds:  sodium chloride   Intravenous Once   atorvastatin  40 mg Oral QHS   carvedilol  25 mg Oral BID WC   Chlorhexidine Gluconate Cloth  6 each Topical Q0600   DULoxetine  30 mg Oral BID   feeding supplement (NEPRO CARB STEADY)  237 mL Oral BID BM   gabapentin  400 mg Oral TID   isosorbide dinitrate  10 mg Oral TID   levETIRAcetam  500 mg Oral BID   pantoprazole  40 mg Oral Daily   polyethylene glycol  17 g Oral Daily   senna-docusate  1 tablet Oral BID   sodium chloride flush  3 mL Intravenous Q12H   tamsulosin  0.4 mg Oral Daily   Continuous Infusions:  ferric gluconate (FERRLECIT) IVPB 250 mg (03/15/22 1001)     LOS: 3 days    Time  spent: 15min    Domenic Polite, MD [facility]   03/15/2022, 11:12 AM

## 2022-03-15 NOTE — Evaluation (Signed)
Occupational Therapy Evaluation Patient Details Name: [name] MRN: [id number] DOB: 15-Nov-1965 Today's Date: 03/15/2022   History of Present Illness Pt is a 57 y.o. male s/p B AKA 03/12/22 due to ischemic B BKA.  He had a recent hospitalization (Dec 2023) in [place] with rhabdomyolysis and worsening renal failure.  PMH: seizures, PAD with h/o B BKAs (~19 yeas ago), COPD, chronic pain, CHF, anemia, depression, GERD, hypertension, ESRD on HD MWF, neuropathy   Clinical Impression   PTA pt lives with his wife @ a modified independent level using his w/c. Pt presents with a decline in functional status due to deficits listed below. Pt very motivated to become independent again at w/c level. Acute OT to follow to facilitate a safe DC home with HHOT and DME listed below. (Recommend pt follow up with the seating clinic at the neuro outpt center to assess w/c needs when appropriate.)    Recommendations for follow up therapy are one component of a multi-disciplinary discharge planning process, led by the attending physician.  Recommendations may be updated based on patient status, additional functional criteria and insurance authorization.   Follow Up Recommendations  Home health OT     Assistance Recommended at Discharge Frequent or constant Supervision/Assistance  Patient can return home with the following A little help with walking and/or transfers;A little help with bathing/dressing/bathroom;Assistance with cooking/housework;Assist for transportation;Help with stairs or ramp for entrance    Functional Status Assessment  Patient has had a recent decline in their functional status and demonstrates the ability to make significant improvements in function in a reasonable and predictable amount of time.  Equipment Recommendations  BSC/3in1;Tub/shower bench;Other (comment) (drop arm BSC; transfer board)    Recommendations for Other Services       Precautions / Restrictions  Precautions Precautions: Fall;Other (comment) Precaution Comments: wound vacs B residual limbs      Mobility Bed Mobility Overal bed mobility: Modified Independent                  Transfers                Has transferred to the toilet and to his w/c with wife's assistance only. Pt previously reliant on his residual limb to get in/out of car - will now need transfer board   General transfer comment: pt has been OOB, asking to stay in bed at this time secondary to going to dialysis      Balance Overall balance assessment: Needs assistance Sitting-balance support: Bilateral upper extremity supported Sitting balance-Leahy Scale: Fair                                     ADL either performed or assessed with clinical judgement   ADL Overall ADL's : Needs assistance/impaired     Grooming: Set up   Upper Body Bathing: Set up;Bed level   Lower Body Bathing: Minimal assistance;Bed level   Upper Body Dressing : Set up;Bed level   Lower Body Dressing: Minimal assistance;Bed level   Toilet Transfer: Minimal assistance   Toileting- Clothing Manipulation and Hygiene: Minimal assistance       Functional mobility during ADLs: Minimal assistance;Cueing for safety General ADL Comments: Pt states he transferred on/off of the toilet with assistance of his wife and staff. Educated on ant/post transfers to [facility]; w/c from bed to reduce risk of falls; will need transfer board for car transfers. Issued reacher and  long handled sponge to assist with ADL and item retrieval to reduce risk of falls     Vision Baseline Vision/History: 1 Wears glasses       Perception     Praxis      Pertinent Vitals/Pain Pain Assessment Pain Assessment: Faces Faces Pain Scale: Hurts little more Pain Location: B residual limbs Pain Descriptors / Indicators: Discomfort, Sore Pain Intervention(s): Limited activity within patient's tolerance     Hand Dominance      Extremity/Trunk Assessment Upper Extremity Assessment Upper Extremity Assessment: Overall WFL for tasks assessed (c/o numbness/tingling in hands)   Lower Extremity Assessment RLE Deficits / Details: s/p BKA to AKA, wound vac in place LLE Deficits / Details: s/p BKA to AKA, wound vac in place   Cervical / Trunk Assessment Cervical / Trunk Assessment: Kyphotic (numbness in peri-area/bottom)   Communication Communication Communication: No difficulties   Cognition Arousal/Alertness: Awake/alert Behavior During Therapy: WFL for tasks assessed/performed Overall Cognitive Status: Within Functional Limits for tasks assessed                                       General Comments       Exercises     Shoulder Instructions      Home Living Family/patient expects to be discharged to:: Private residence Living Arrangements: Spouse/significant other;Parent Available Help at Discharge: Family;Available 24 hours/day Type of Home: House Home Access: Stairs to enter;Ramped entrance Entrance Stairs-Number of Steps: ramp, then step onto porch, and then threshold into house Entrance Stairs-Rails: None Home Layout: One level     Bathroom Shower/Tub: Teacher, early years/pre: Standard Bathroom Accessibility: Yes How Accessible: Accessible via wheelchair Home Equipment: Shower seat;Wheelchair - manual          Prior Functioning/Environment Prior Level of Function : Independent/Modified Independent             Mobility Comments: mod I at w/c level. Only needed help putting w/c in/out of car. Pt did not wear LE prosthesis.          OT Problem List: Decreased strength;Decreased activity tolerance;Impaired balance (sitting and/or standing);Decreased knowledge of use of DME or AE;Impaired sensation;Pain      OT Treatment/Interventions: Self-care/ADL training;Therapeutic exercise;DME and/or AE instruction;Therapeutic activities;Patient/family  education;Balance training    OT Goals(Current goals can be found in the care plan section) Acute Rehab OT Goals Patient Stated Goal: to be independent again OT Goal Formulation: With patient/family Time For Goal Achievement: 03/29/22 Potential to Achieve Goals: Good  OT Frequency: Min 2X/week    Co-evaluation              AM-PAC OT "6 Clicks" Daily Activity     Outcome Measure Help from another person eating meals?: None Help from another person taking care of personal grooming?: A Little Help from another person toileting, which includes using toliet, bedpan, or urinal?: A Little Help from another person bathing (including washing, rinsing, drying)?: A Little Help from another person to put on and taking off regular upper body clothing?: A Little Help from another person to put on and taking off regular lower body clothing?: A Little 6 Click Score: 19   End of Session Nurse Communication: Mobility status  Activity Tolerance: Patient tolerated treatment well Patient left: in bed;with call bell/phone within reach;with family/visitor present  OT Visit Diagnosis: Other abnormalities of gait and mobility (R26.89);Muscle weakness (generalized) (M62.81);Pain Pain -  Right/Left:  (B) Pain - part of body:  (AKA)                Time: 8101-7510 OT Time Calculation (min): 15 min Charges:  OT General Charges $OT Visit: 1 Visit OT Evaluation $OT Eval Moderate Complexity: [place], OT/L   Acute OT Clinical Specialist Acute Rehabilitation Services Pager [phone] Office [phone]   [facility] 03/15/2022, 10:51 AM

## 2022-03-15 NOTE — TOC Initial Note (Addendum)
Transition of Care [facility]) - Initial/Assessment Note    Patient Details  Name: [name] MRN: [id number] Date of Birth: 07-Mar-1965  [facility] CM/SW Contact:    Cyndi Bender, RN Phone Number: 03/15/2022, 12:16 PM  Clinical Narrative:                 Spoke to a patient and wife regarding transition needs. Patient agreeable to use in house provider [employer] for DME needs Erasmo Downer with [employer] notified of DME needs. Patient defers to this RNCM to find highly rated agency. Claiborne Billings with [facility] sent referral. Claiborne Billings states she will send to that branch and let this RNCM know.   Nixa with [facility] reached back out and that branch can accept referral.   Expected Discharge Plan: [place] Barriers to Discharge: Continued Medical Work up   Patient Goals and CMS Choice Patient states their goals for this hospitalization and ongoing recovery are:: return home CMS Medicare.gov Compare Post Acute Care list provided to:: Patient Choice offered to / list presented to : Patient      Expected Discharge Plan and Services   Discharge Planning Services: CM Consult Post Acute Care Choice: Durable Medical Equipment, Home Health Living arrangements for the past 2 months: Single Family Home                 DME Arranged: Tub bench, Bedside commode (transfer board) DME Agency: [facility] Date DME Agency Contacted: 03/15/22 Time DME Agency Contacted: [phone] Representative spoke with at DME Agency: Erasmo Downer HH Arranged: OT, PT [place] Agency: [place] Date [place]: 03/15/22 Time [place]: [phone] Representative spoke with at [place]: Claiborne Billings  Prior Living Arrangements/Services Living arrangements for the past 2 months: [place] with:: Spouse Patient language and need for interpreter reviewed:: Yes Do you feel safe going back to the place where you live?: Yes      Need for Family Participation in Patient  Care: Yes (Comment) Care giver support system in place?: Yes (comment) Current home services: Home OT, Home PT Criminal Activity/Legal Involvement Pertinent to Current Situation/Hospitalization: No - Comment as needed  Activities of Daily Living Home Assistive Devices/Equipment: Wheelchair ADL Screening (condition at time of admission) Patient's cognitive ability adequate to safely complete daily activities?: Yes Is the patient deaf or have difficulty hearing?: No Does the patient have difficulty seeing, even when wearing glasses/contacts?: No Does the patient have difficulty concentrating, remembering, or making decisions?: No Patient able to express need for assistance with ADLs?: Yes Does the patient have difficulty dressing or bathing?: No Independently performs ADLs?: Yes (appropriate for developmental age) Does the patient have difficulty walking or climbing stairs?: Yes Weakness of Legs: Both Weakness of Arms/Hands: None  Permission Sought/Granted Permission sought to share information with : Investment banker, corporate granted to share info w AGENCY: [facility]        Emotional Assessment   Attitude/Demeanor/Rapport: Gracious Affect (typically observed): Accepting Orientation: : Oriented to Self, Oriented to  Time, Oriented to Place, Oriented to Situation Alcohol / Substance Use: Not Applicable Psych Involvement: No (comment)  Admission diagnosis:  Ischemia of left BKA site ([place]) [T87.89, I99.8] Patient Active Problem List   Diagnosis Date Noted   Neuropathy 03/12/2022   Ischemia of right BKA site ([place]) 03/12/2022   Ischemia of left BKA site ([place]) 03/12/2022   Status post bilateral above knee amputation ([place]) 03/12/2022   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   S/P BKA (below knee amputation) bilateral ([place]) 12/28/2012   PAD (peripheral artery disease) ([place]) 12/28/2012   CHF (congestive heart failure) ([place]) 12/28/2012   COPD (chronic  obstructive pulmonary disease) ([place]) 12/28/2012   Opioid dependence ([place]) 12/28/2012   Chronic pain syndrome 12/28/2012   Seizures ([place]) 12/28/2012   PCP:  Lorelee Market, MD Pharmacy:   [facility], [place] [address] Phone: [phone] Fax: [phone]     Social Determinants of Health (SDOH) Social History: SDOH Screenings   Tobacco Use: High Risk (03/13/2022)   SDOH Interventions:     Readmission Risk Interventions     No data to display

## 2022-03-15 NOTE — Progress Notes (Signed)
Pt receives out-pt HD at [facility] on MWF. Pt arrives at 11:10 for 11:30 chair time. Will assist as needed.   Melven Sartorius Renal Navigator [phone]

## 2022-03-15 NOTE — Progress Notes (Signed)
Occupational Therapy Treatment Note  Pt educated on ADL @ bed level and established T-band HEP in addition to core exercises to work on while in hospital. Excellent participation. Pt will need DME listed below to safely DC home with wife.     03/15/22 1058  OT Visit Information  Last OT Received On 03/15/22  Assistance Needed +1  History of Present Illness Pt is a 57 y.o. male s/p B AKA 03/12/22 due to ischemic B BKA.  He had a recent hospitalization (Dec 2023) in [place] with rhabdomyolysis and worsening renal failure.  PMH: seizures, PAD with h/o B BKAs (~19 yeas ago), COPD, chronic pain, CHF, anemia, depression, GERD, hypertension, ESRD on HD MWF, neuropathy  Precautions  Precautions Fall;Other (comment)  Precaution Comments wound vacs B residual limbs  Pain Assessment  Pain Assessment Faces  Faces Pain Scale 4  Pain Location B residual limbs  Pain Descriptors / Indicators Discomfort;Sore  Pain Intervention(s) Limited activity within patient's tolerance  Cognition  Arousal/Alertness Awake/alert  Behavior During Therapy WFL for tasks assessed/performed  Overall Cognitive Status Within Functional Limits for tasks assessed  ADL  General ADL Comments Educated on completing dressing @ bed level to reduce risk of falls at this time. [facility] and completing anterior/posterior transfers until he is stronger and able to laterally scoot to toilet at home. Educated on need for tub bench rather than shower seat.  Exercises  Exercises General Upper Extremity;Other exercises  General Exercises - Upper Extremity  Shoulder ABduction Strengthening;Both;10 reps;Seated;Theraband  Elbow Extension Strengthening;Both;10 reps;Theraband  Shoulder Flexion Strengthening;Both;10 reps;Theraband  Chair Push Up Strengthening;Both;10 reps  Theraband Level (Shoulder Flexion) Level 2 (Red)  Theraband Level (Shoulder Abduction) Level 2 (Red)  Theraband Level (Elbow Extension) Level 2 (Red)   Other Exercises  Other Exercises core strengthening - sitting unsupoorte din bed in upright posture, once stable, raising RUE/then LUE, slternating to work on core control and balance  Other Exercises lawn mower pull backs with level 2 band x 10 BUE  Other Exercises upright rows x 10  OT - End of Session  Activity Tolerance Patient tolerated treatment well  Patient left in bed;with call bell/phone within reach;with family/visitor present  Nurse Communication Mobility status;Other (comment)  OT Assessment/Plan  OT Plan Discharge plan remains appropriate  OT Visit Diagnosis Other abnormalities of gait and mobility (R26.89);Muscle weakness (generalized) (M62.81);Pain  Pain - part of body  (B residual limbs)  OT Frequency (ACUTE ONLY) Min 2X/week  Follow Up Recommendations Home health OT  Assistance recommended at discharge Frequent or constant Supervision/Assistance  Patient can return home with the following A little help with walking and/or transfers;A little help with bathing/dressing/bathroom;Assistance with cooking/housework;Assist for transportation;Help with stairs or ramp for entrance  OT Equipment BSC/3in1;Tub/shower bench;Other (comment) (transfer board)  AM-PAC OT "6 Clicks" Daily Activity Outcome Measure (Version 2)  Help from another person eating meals? 4  Help from another person taking care of personal grooming? 3  Help from another person toileting, which includes using toliet, bedpan, or urinal? 3  Help from another person bathing (including washing, rinsing, drying)? 3  Help from another person to put on and taking off regular upper body clothing? 3  Help from another person to put on and taking off regular lower body clothing? 3  6 Click Score 19  Progressive Mobility  What is the highest level of mobility based on the progressive mobility assessment? Level 2 ([place]) - Balance while sitting on edge of bed and cannot stand  Mobility Referral Yes  Activity Moved  into chair position in bed  OT Goal Progression  Progress towards OT goals Progressing toward goals  Acute Rehab OT Goals  Patient Stated Goal to go home tomorrow  OT Goal Formulation With patient/family  Time For Goal Achievement 03/29/22  Potential to Achieve Goals Good  OT Time Calculation  OT Start Time (ACUTE ONLY) 1029  OT Stop Time (ACUTE ONLY) 1048  OT Time Calculation (min) 19 min  OT General Charges  $OT Visit 1 Visit  OT Treatments  $Therapeutic Activity 8-22 mins   Maurie Boettcher, OT/L   Acute OT Clinical Specialist [place] Pager [phone] Office [phone]

## 2022-03-15 NOTE — Care Management Important Message (Signed)
Important Message  Patient Details  Name: [name] MRN: [id number] Date of Birth: [birth year]   Medicare Important Message Given:  Yes     Hannah Beat 03/15/2022, 2:30 PM

## 2022-03-15 NOTE — Progress Notes (Addendum)
[facility] Progress Note   57 y.o. male seizures, PAD, COPD, chronic pain, HTN, AKI  MWF at [place] with last dialysis treatment on Wednesday; started HD 12/27. 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 He had successful b/l AKA's by Dr. Stanford Breed and  in the PACU vitals were stable with the patient in discomfort. Of note his BUN/Cr was 24/1.44. He denies shortness of breath, fever, chills, nausea or chest pain.    Assessment/ Plan:   AKI - MWF @ [facility] on dialysis since 02/03/22 Tolerated HD  on 2/3 w/ 2L UF 24h urine ordered yesterday -- was not kept on ice, so won't run it.   Labs and volume look great today - will hold dialysis -- his outpt MD is also assessing for recovery. He is set for DC tomorrow which is fine.  He will complete a 24h urine collection at home and take to his own dialysis clinic later this week.   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  Anemia - Hb 9 > 5s > transfused > now 8s.  Noted to be iron deficient 2/3 sat 10%.  PAD with distal ischemia s/p b/l AKA's successfully on 2/2. COPD Malnutrition - additional protein shakes qdaily; nepro BID Hypokalemia - has been repleted  I'll look at labs in the AM but if stable ok with discharge with plan for eval for recovery of renal function at outpt HD unit.  I think if we try to obtain the 24h collection here now it will delay disposition which is un necessary - he needs to be home tomorrow as wife has outpt procedure Wed.   Subjective:   Feeling good.  Cr down to 1.29 today.  Eating good lunch.  For d/c tomorrow.   UOP 700 yesterday.     Objective:   BP 135/87 (BP Location: Left Arm)   Pulse 97   Temp (!) 97.3 F (36.3 C) (Oral)   Resp 17   Ht 5\' 10"  (1.778 m)   Wt 52.4 kg   SpO2 95%   BMI 16.58 kg/m   Intake/Output Summary (Last 24 hours) at 03/15/2022 1123 Last data filed at  03/15/2022 6387 Gross per 24 hour  Intake 390 ml  Output 700 ml  Net -310 ml    Weight change:   Physical Exam: General appearance: alert, cooperative, and appears stated age Head: NCAT  Eyes: negative Resp: CTA b/l Cardio: RRR GI: soft, non-tender; bowel sounds normal; no masses,  no organomegaly Extremities: B/L AKA's  Access: RIJ TC  Imaging: No results found.  Labs: BMET Recent Labs  Lab 03/12/22 1104 03/13/22 0258 03/14/22 0317 03/15/22 0258  NA 138 136 136 137  K 4.5 5.2* 3.5 3.3*  CL 103 105 100 101  CO2 21* 19* 28 26  GLUCOSE 78 117* 116* 109*  BUN 22* 31* 20 22*  CREATININE 1.44* 1.56* 1.54* 1.29*  CALCIUM 8.6* 7.9* 7.7* 7.9*    CBC Recent Labs  Lab 03/13/22 0258 03/13/22 0414 03/14/22 0317 03/15/22 0258  WBC 6.8 6.5 5.7 5.5  HGB 5.5* 5.4* 8.2* 8.0*  HCT 18.9* 17.2* 24.4* 24.7*  MCV 101.6* 98.3 88.7 91.1  PLT 407* 429* 275 282     Medications:     sodium chloride   Intravenous Once   aspirin  81 mg Oral Daily   atorvastatin  40 mg Oral QHS  carvedilol  25 mg Oral BID WC   Chlorhexidine Gluconate Cloth  6 each Topical Q0600   clopidogrel  75 mg Oral Daily   DULoxetine  30 mg Oral BID   feeding supplement (NEPRO CARB STEADY)  237 mL Oral BID BM   gabapentin  400 mg Oral TID   isosorbide dinitrate  10 mg Oral TID   levETIRAcetam  500 mg Oral BID   pantoprazole  40 mg Oral Daily   polyethylene glycol  17 g Oral Daily   potassium chloride  40 mEq Oral Once   senna-docusate  1 tablet Oral BID   sodium chloride flush  3 mL Intravenous Q12H   tamsulosin  0.4 mg Oral Daily    Jannifer Hick MD [facility] Pager [phone]

## 2022-03-15 NOTE — Progress Notes (Signed)
   Durable Medical Equipment (From admission, onward)        Start     Ordered  03/15/22 1200  For home use only DME Other see comment  Once      Comments: Tub /shower transfer bench Question:  Length of Need  Answer:  Lifetime  03/15/22 1159  03/15/22 1159  For home use only DME Other see comment  Once      Comments: Transfer board Question:  Length of Need  Answer:  Lifetime  03/15/22 1159  03/15/22 1158  For home use only DME Bedside commode  Once      Comments: Bilateral AKA, needs drop down arm Question:  Patient needs a bedside commode to treat with the following condition  Answer:  Below knee amputation ([place])  03/15/22 1159   Patient is confided to one room and new bilateral AKA

## 2022-03-16 DIAGNOSIS — T8789 Other complications of amputation stump: Secondary | ICD-10-CM | POA: Diagnosis not present

## 2022-03-16 DIAGNOSIS — I998 Other disorder of circulatory system: Secondary | ICD-10-CM | POA: Diagnosis not present

## 2022-03-16 LAB — CBC
HCT: 25.9 % — ABNORMAL LOW (ref 39.0–52.0)
Hemoglobin: 8.3 g/dL — ABNORMAL LOW (ref 13.0–17.0)
MCH: 29.7 pg (ref 26.0–34.0)
MCHC: 32 g/dL (ref 30.0–36.0)
MCV: 92.8 fL (ref 80.0–100.0)
Platelets: 323 10*3/uL (ref 150–400)
RBC: 2.79 MIL/uL — ABNORMAL LOW (ref 4.22–5.81)
RDW: 16.6 % — ABNORMAL HIGH (ref 11.5–15.5)
WBC: 7.3 10*3/uL (ref 4.0–10.5)
nRBC: 0 % (ref 0.0–0.2)

## 2022-03-16 LAB — BASIC METABOLIC PANEL
Anion gap: 10 (ref 5–15)
BUN: 25 mg/dL — ABNORMAL HIGH (ref 6–20)
CO2: 24 mmol/L (ref 22–32)
Calcium: 8.1 mg/dL — ABNORMAL LOW (ref 8.9–10.3)
Chloride: 101 mmol/L (ref 98–111)
Creatinine, Ser: 0.93 mg/dL (ref 0.61–1.24)
GFR, Estimated: >60 mL/min (ref >60)
Glucose, Bld: 93 mg/dL (ref 70–99)
Potassium: 3.9 mmol/L (ref 3.5–5.1)
Sodium: 135 mmol/L (ref 135–145)

## 2022-03-16 MED ORDER — HYDROCODONE-ACETAMINOPHEN 10-325 MG PO TABS: 1.0000 | ORAL_TABLET | Freq: Four times a day (QID) | ORAL | 0 refills | Status: AC

## 2022-03-16 NOTE — Progress Notes (Addendum)
[facility] Progress Note   57 y.o. male seizures, PAD, COPD, chronic pain, HTN, AKI  MWF at [place] with last dialysis treatment on Wednesday; started HD 12/27. 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 He had successful b/l AKA's by Dr. Stanford Breed and  in the PACU vitals were stable with the patient in discomfort. Of note his BUN/Cr was 24/1.44. He denies shortness of breath, fever, chills, nausea or chest pain.    Assessment/ Plan:   AKI - MWF @ [place] on dialysis since 02/03/22 Tolerated HD  on 2/3 w/ 2L UF and now labs showing that he's recovered renal function with Cr 1.5 > 1.3 > 0.9 without dialysis.  Volume, electrolytes normal.  Will have him f/u with outpt nephrologist locally ([facility]) for lab recheck to confirm and for HD catheter removal. Spoke to APP at [facility] today who will facilitate --> pt to present to HD clinic 2/9 (Friday) for labs and catheter care.  Pt aware.    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  Anemia - Hb 9 > 5s > transfused > now 8s.  Noted to be iron deficient 2/3 sat 10%. Please d/c on po iron PAD with distal ischemia s/p b/l AKA's successfully on 2/2. COPD Malnutrition - has had protein shakes here Hypokalemia - has been repleted and is now normal.  [facility] [phone]   Subjective:   Feeling good.  Cr down to 0.9 today.    For d/c today.   UOP NR yesterday but says ok    :   BP 125/84   Pulse 94   Temp 99 F (37.2 C) (Oral)   Resp 16   Ht 5\' 10"  (1.778 m)   Wt 52.4 kg   SpO2 99%   BMI 16.58 kg/m   Intake/Output Summary (Last 24 hours) at 03/16/2022 0957 Last data filed at 03/16/2022 0815 Gross per 24 hour  Intake 990 ml  Output --  Net 990 ml    Weight change:   Physical Exam: General appearance: alert, cooperative, and appears stated age Head: NCAT  Eyes: negative Resp: CTA b/l Cardio: RRR GI: soft,  non-tender; bowel sounds normal; no masses,  no organomegaly Extremities: B/L AKA's  Access: RIJ TC  Imaging: No results found.  Labs: BMET Recent Labs  Lab 03/12/22 1104 03/13/22 0258 03/14/22 0317 03/15/22 0258 03/16/22 0349  NA 138 136 136 137 135  K 4.5 5.2* 3.5 3.3* 3.9  CL 103 105 100 101 101  CO2 21* 19* 28 26 24   GLUCOSE 78 117* 116* 109* 93  BUN 22* 31* 20 22* 25*  CREATININE 1.44* 1.56* 1.54* 1.29* 0.93  CALCIUM 8.6* 7.9* 7.7* 7.9* 8.1*    CBC Recent Labs  Lab 03/13/22 0414 03/14/22 0317 03/15/22 0258 03/16/22 0349  WBC 6.5 5.7 5.5 7.3  HGB 5.4* 8.2* 8.0* 8.3*  HCT 17.2* 24.4* 24.7* 25.9*  MCV 98.3 88.7 91.1 92.8  PLT 429* 275 282 323     Medications:     sodium chloride   Intravenous Once   aspirin  81 mg Oral Daily   atorvastatin  40 mg Oral QHS   carvedilol  25 mg Oral BID WC   Chlorhexidine Gluconate Cloth  6 each Topical Q0600   clopidogrel  75 mg Oral Daily   DULoxetine  30 mg Oral BID   feeding supplement (NEPRO CARB STEADY)  237 mL Oral BID BM   gabapentin  400 mg Oral TID   isosorbide dinitrate  10 mg Oral TID   levETIRAcetam  500 mg Oral BID   pantoprazole  40 mg Oral Daily   polyethylene glycol  17 g Oral Daily   senna-docusate  1 tablet Oral BID   sodium chloride flush  3 mL Intravenous Q12H   tamsulosin  0.4 mg Oral Daily    Jannifer Hick MD [facility] Pager [phone]

## 2022-03-16 NOTE — Progress Notes (Signed)
Occupational Therapy Treatment Patient Details Name: [name] MRN: [id number] DOB: 11/10/1965 Today's Date: 03/16/2022   History of present illness Pt is a 57 y.o. male s/p B AKA 03/12/22 due to ischemic B BKA.  He had a recent hospitalization (Dec 2023) in [place] with rhabdomyolysis and worsening renal failure.  PMH: seizures, PAD with h/o B BKAs (~19 yeas ago), COPD, chronic pain, CHF, anemia, depression, GERD, hypertension, ESRD on HD MWF, neuropathy   OT comments  Pt seen for brief visit to review compensatory strategies for bathing/dressing and functional transfers to reduce risk of falls. Issued gait belt. Continue to recommend HHOT follow up.    Recommendations for follow up therapy are one component of a multi-disciplinary discharge planning process, led by the attending physician.  Recommendations may be updated based on patient status, additional functional criteria and insurance authorization.    Follow Up Recommendations  Home health OT     Assistance Recommended at Discharge Frequent or constant Supervision/Assistance (initially)  Patient can return home with the following  A little help with walking and/or transfers;A little help with bathing/dressing/bathroom;Assistance with cooking/housework;Assist for transportation;Help with stairs or ramp for entrance   Equipment Recommendations  BSC/3in1;Tub/shower bench;Other (comment)    Recommendations for Other Services      Precautions / Restrictions Precautions Precautions: Fall;Other (comment)       Mobility Bed Mobility                    Transfers Overall transfer level: Modified independent                       Balance                                           ADL either performed or assessed with clinical judgement   ADL                                         General ADL Comments: reviewed compensatory strategies for ADL - discussed needing  to complete bathing/dressing @ bed level at this time due to balance deficits, getting use to being without counter balance of lower legs. Talked aobut using anterioro/posterior transfer onto [facility] or using drop arm feature as balance improves. Wife can assist as needed; discussed tying bag onto w/c and putting reacher in bag to decrease riskf of falls from w/c.    Extremity/Trunk Assessment              Vision       Perception     Praxis      Cognition Arousal/Alertness: Awake/alert Behavior During Therapy: WFL for tasks assessed/performed Overall Cognitive Status: Within Functional Limits for tasks assessed                                          Exercises      Shoulder Instructions       General Comments reviewed importance of pressure relief every 2 hours and not dragging/causing friction to bottom part of residual limbs; issued gait belt for wife to use when transferring to reduce risk of falls; also discussed importance of smoking cessation -  encouraged pt to reach out to his physician if he has significnat cravings or needs help with cessation. Pt verbalized understanding    Pertinent Vitals/ Pain       Pain Assessment Pain Assessment: Faces Faces Pain Scale: Hurts a little bit Pain Location: B residual limbs Pain Descriptors / Indicators: Discomfort, Sore Pain Intervention(s): Limited activity within patient's tolerance  Home Living                                          Prior Functioning/Environment              Frequency  Min 2X/week        Progress Toward Goals  OT Goals(current goals can now be found in the care plan section)  Progress towards OT goals: Goals met/education completed, patient discharged from OT  Acute Rehab OT Goals Patient Stated Goal: to go home OT Goal Formulation: With patient/family Time For Goal Achievement: 03/29/22 Potential to Achieve Goals: Good ADL Goals Pt Will Transfer to  Toilet: with modified independence;bedside commode;anterior/posterior transfer Pt Will Perform Tub/Shower Transfer: with min guard assist;tub bench;with caregiver independent in assisting Additional ADL Goal #1: pt will complete bed level bathing/dressing with set up Additional ADL Goal #2: pt/wife will verbalize 3 strategies to reduce risk of falls  Plan Discharge plan remains appropriate    Co-evaluation                 AM-PAC OT "6 Clicks" Daily Activity     Outcome Measure   Help from another person eating meals?: None Help from another person taking care of personal grooming?: A Little Help from another person toileting, which includes using toliet, bedpan, or urinal?: A Little Help from another person bathing (including washing, rinsing, drying)?: A Little Help from another person to put on and taking off regular upper body clothing?: A Little Help from another person to put on and taking off regular lower body clothing?: A Little 6 Click Score: 19    End of Session    OT Visit Diagnosis: Other abnormalities of gait and mobility (R26.89);Muscle weakness (generalized) (M62.81);Pain   Activity Tolerance Patient tolerated treatment well   Patient Left Other (comment) (in w/c - being discharged)   Nurse Communication Other (comment) (DC needs)        Time: 4665-9935 OT Time Calculation (min): 10 min  Charges: OT General Charges $OT Visit: 1 Visit OT Treatments $Self Care/Home Management : 8-22 mins  Maurie Boettcher, OT/L   Acute OT Clinical Specialist [place] Pager [phone] Office [phone]   [facility] 03/16/2022, 11:13 AM

## 2022-03-16 NOTE — Progress Notes (Addendum)
[facility]  Subjective  - No new complaints   Objective 125/84 94 99 F (37.2 C) (Oral) 16 99%  Intake/Output Summary (Last 24 hours) at 03/16/2022 0900 Last data filed at 03/16/2022 0815 Gross per 24 hour  Intake 990 ml  Output --  Net 990 ml    Vacs removed B AKA Stump incisions healing well and stumps appear viable Left pin point bleeding at skin edges, dry dressing placed  Assessment/Planning: POD # 4 Revision B BKA to AKA due to ischemic skin changes non healing  Vacs removed today  Stumps appear viable F/U in 4 weeks for staples removal HH PT/OT  Roxy Horseman 03/16/2022 9:00 AM --  Laboratory Lab Results: Recent Labs    03/15/22 0258 03/16/22 0349  WBC 5.5 7.3  HGB 8.0* 8.3*  HCT 24.7* 25.9*  PLT 282 323   BMET Recent Labs    03/15/22 0258 03/16/22 0349  NA 137 135  K 3.3* 3.9  CL 101 101  CO2 26 24  GLUCOSE 109* 93  BUN 22* 25*  CREATININE 1.29* 0.93  CALCIUM 7.9* 8.1*    COAG Lab Results  Component Value Date   INR 1.2 03/12/2022   No results found for: "PTT"   VASCULAR STAFF ADDENDUM: I have independently interviewed and examined the patient. I agree with the above.   Yevonne Aline. Stanford Breed, MD [facility] Phone Number: [phone] 03/16/2022 10:17 AM

## 2022-03-16 NOTE — Progress Notes (Signed)
D/C order noted. Nephrologist note reviewed. Provider contacted APP at [facility] to discuss pt's current status and f/u needs at d/c. Pt also advised by nephrologist of needs/plans for out-pt f/u at d/c. No other needs noted by provider.   Melven Sartorius Renal Navigator [phone]

## 2022-03-17 NOTE — Progress Notes (Signed)
Late Entry Note  Last renal note faxed to [facility] this morning for continuation of care. There is no d/c summary available at this time.   Melven Sartorius Renal Navigator [phone]

## 2022-03-22 ENCOUNTER — Inpatient Hospital Stay: Admit: 2022-03-22 | Payer: Medicare Other | Admitting: Vascular Surgery

## 2022-03-22 SURGERY — AMPUTATION, ABOVE KNEE
Anesthesia: General | Site: Knee | Laterality: Bilateral

## 2022-03-23 ENCOUNTER — Telehealth: Payer: Self-pay

## 2022-03-23 ENCOUNTER — Other Ambulatory Visit: Payer: Self-pay | Admitting: Physician Assistant

## 2022-03-23 DIAGNOSIS — I739 Peripheral vascular disease, unspecified: Secondary | ICD-10-CM

## 2022-03-23 MED ORDER — HYDROCODONE-ACETAMINOPHEN 5-325 MG PO TABS: 1.0000 | ORAL_TABLET | Freq: Two times a day (BID) | ORAL | 0 refills | Status: DC

## 2022-03-23 NOTE — Telephone Encounter (Signed)
Pt's wife, [name], called requesting additional pain medication refill for pt. He is going to f/u with his PCP next week.  Spoke with Gwenette Greet, [place] who sent in a small refill to last pt until seen.  Reviewed pt's chart, returned call for clarification, two identifiers used. Informed pt and instructed her to also have the PCP discuss adjusting Gabapentin. Pt is having additional sharp shooting pains like a shock. Confirmed understanding.

## 2022-03-27 NOTE — Discharge Summary (Addendum)
Physician Discharge Summary  [name] GEX:[id number] DOB: 1965-03-26 DOA: 03/12/2022  PCP: Evelene Croon, MD  Admit date: 03/12/2022 Discharge date: 03/16/2022  Time spent:35  minutes  Recommendations for Outpatient Follow-up:  Vascular surgery in 1 to 2 weeks PCP in 1 week  Discharge Diagnoses:  Principal Problem:   Ischemia of left BKA site [facility]) Active Problems:   PAD (peripheral artery disease) (HCC)   CHF (congestive heart failure) (HCC)   COPD (chronic obstructive pulmonary disease) (HCC)   Opioid dependence (HCC)   Chronic pain syndrome   Seizures (HCC)   Occlusion of terminal aorta (HCC)   Ischemia of right BKA site [facility])   Status post bilateral above knee amputation [facility])   Discharge Condition: Improved  Diet recommendation: Heart healthy  Filed Weights   03/12/22 1107 03/13/22 0629 03/15/22 0552  Weight: 47.6 kg 52 kg 52.4 kg    History of present illness:  56/M with history of PAD, bilateral BKA, COPD, chronic pain, CHF, anemia, depression, GERD, hypertension, ESRD on HD MWF, neuropathy presented for surgical intervention of bilateral BKA ischemia. -known history of significant PAD and prior failed femoral bypass resulting in bilateral BKA's in the past.  Has been experiencing ischemia of his distal stump in the setting of distal aortic occlusion.  Was seen by vascular surgery and recommended bilateral AKA with counseling that his aortic disease may make it difficult for this to heal as well.  He was admitted 2/2 for surgical intervention and had successful bilateral AKA performed without complication  Hospital Course:   PAD Ischemia of bilateral BKA sites -Known history of PAD and terminal aorta occlusion.  History of failed femoral bypass leading to history of BKA bilaterally.  With ischemia of bilateral BKA -Underwent bilateral AKA 2/2 -Resume aspirin and Plavix -Multimodal pain control, Dilaudid, Vicodin, gabapentin, Cymbalta -Remains in wound  VAC, per vascular surgery -Pathology on both legs indicated ulcers with acute inflammation and necrosis and severe atherosclerosis with a 90% stenosis in both legs  -PT eval completed, patient declines rehab, discharged home with home health services, follow-up with vascular surgery   Acute blood loss anemia -Transfused 2 units of PRBC, anemia panel with iron deficiency, continue IV iron aspirin Plavix -Hemoglobin improved and stable   Chronic pain -On Norco at home, pain control as above - Continue home duloxetine, gabapentin -Add laxatives   Seizure disorder - Continue home Keppra   ESRD on HD -Has tunneled HD catheter -Nephrology following, dialyzed yesterday   COPD - Continue as needed DuoNebs   GERD - Continue home PPI   CHF > Unclear if reduced or preserved ejection fraction as recent notes send both.  Unable to view echocardiogram from last year and available. > Volume managed by HD - Continue home carvedilol and Isordil   Consultants: Left above-knee amputation, right above-knee amputation 2/2 Dr. Lenell Antu  Discharge Exam: Vitals:   03/16/22 0744 03/16/22 0807  BP: (!) 135/92 125/84  Pulse:  94  Resp:  16  Temp:    SpO2:  99%    General exam: Chronically ill male sitting up in bed, AAOx3 HEENT: Right IJ HD catheter CVS: S1-S2, regular rhythm Lungs: Poor air movement bilaterally Abdomen: Soft, nontender, bowel sounds present Extremities: Bilateral AKA with wound VAC Psychiatry:  Mood & affect appropriate.   Discharge Instructions   Discharge Instructions     Diet - low sodium heart healthy   Complete by: As directed    Discharge wound care:   Complete by: As  directed    routine   Increase activity slowly   Complete by: As directed       Allergies as of 03/16/2022   No Known Allergies      Medication List     TAKE these medications    aspirin EC 81 MG tablet Take 81 mg by mouth daily.   atorvastatin 40 MG tablet Commonly known as:  LIPITOR Take 40 mg by mouth at bedtime.   carvedilol 25 MG tablet Commonly known as: COREG Take 25 mg by mouth 2 (two) times daily with a meal.   clopidogrel 75 MG tablet Commonly known as: PLAVIX Take 75 mg by mouth daily.   DULoxetine 30 MG capsule Commonly known as: CYMBALTA Take 30 mg by mouth 2 (two) times daily.   gabapentin 400 MG capsule Commonly known as: NEURONTIN Take 400 mg by mouth 3 (three) times daily.   HYDROcodone-acetaminophen 10-325 MG tablet Commonly known as: NORCO Take 1 tablet by mouth every 6 (six) hours.   ipratropium-albuterol 0.5-2.5 (3) MG/3ML Soln Commonly known as: DUONEB Inhale 3 mLs into the lungs every 4 (four) hours as needed (COPD).   isosorbide dinitrate 10 MG tablet Commonly known as: ISORDIL Take 10 mg by mouth 3 (three) times daily.   levETIRAcetam 500 MG tablet Commonly known as: KEPPRA Take 500 mg by mouth 2 (two) times daily.   levocetirizine 5 MG tablet Commonly known as: XYZAL Take 5 mg by mouth every evening.   meclizine 25 MG tablet Commonly known as: ANTIVERT Take 25 mg by mouth 3 (three) times daily as needed for dizziness.   Movantik 25 MG Tabs tablet Generic drug: naloxegol oxalate Take 25 mg by mouth daily as needed (Constipation).   pantoprazole 40 MG tablet Commonly known as: PROTONIX Take 40 mg by mouth daily.   pyridOXINE 100 MG tablet Commonly known as: VITAMIN B6 Take 100 mg by mouth daily.   tadalafil 20 MG tablet Commonly known as: CIALIS Take 20 mg by mouth daily as needed for erectile dysfunction.   tamsulosin 0.4 MG Caps capsule Commonly known as: FLOMAX Take 0.4 mg by mouth daily.   thiamine 100 MG tablet Commonly known as: VITAMIN B1 Take 100 mg by mouth daily.               Discharge Care Instructions  (From admission, onward)           Start     Ordered   03/16/22 0000  Discharge wound care:       Comments: routine   03/16/22 1000           No Known Allergies   Follow-up Information     [facility] .   Specialty: [facility] Contact information: [address] [place] [address] [phone]         [facility] Follow up.   Specialty: Home Health Services Why: Home health has been arranged. They will contact you within 48hrhs post discharge. Contact information: [address] [place] [address] [phone]         Leonie Douglas, MD Follow up in 4 week(s).   Specialties: Vascular Surgery, Interventional Cardiology Why: Office will call you to arrange your appt (sent) Contact information: [address] [place] [address] [phone]                  The results of significant diagnostics from this hospitalization (including imaging, microbiology, ancillary  and laboratory) are listed below for reference.    Significant Diagnostic Studies: No results found.  Microbiology: No results found for this or any previous visit (from the past 240 hour(s)).   Labs: Basic Metabolic Panel: No results for input(s): "NA", "K", "CL", "CO2", "GLUCOSE", "BUN", "CREATININE", "CALCIUM", "MG", "PHOS" in the last 168 hours. Liver Function Tests: No results for input(s): "AST", "ALT", "ALKPHOS", "BILITOT", "PROT", "ALBUMIN" in the last 168 hours. No results for input(s): "LIPASE", "AMYLASE" in the last 168 hours. No results for input(s): "AMMONIA" in the last 168 hours. CBC: No results for input(s): "WBC", "NEUTROABS", "HGB", "HCT", "MCV", "PLT" in the last 168 hours. Cardiac Enzymes: No results for input(s): "CKTOTAL", "CKMB", "CKMBINDEX", "TROPONINI" in the last 168 hours. BNP: BNP (last 3 results) No results for input(s): "BNP" in the last 8760 hours.  ProBNP (last 3 results) No results for input(s): "PROBNP" in the last 8760 hours.  CBG: No results for input(s): "GLUCAP" in the last 168 hours.     Signed:  Zannie Cove MD.  [facility] 03/27/2022,  2:29 PM

## 2022-04-13 ENCOUNTER — Ambulatory Visit (INDEPENDENT_AMBULATORY_CARE_PROVIDER_SITE_OTHER): Payer: Medicare Other | Admitting: Physician Assistant

## 2022-04-13 VITALS — BP 107/69 | HR 106 | Temp 98.6°F | Resp 20

## 2022-04-13 DIAGNOSIS — Z89611 Acquired absence of right leg above knee: Secondary | ICD-10-CM

## 2022-04-13 DIAGNOSIS — Z89612 Acquired absence of left leg above knee: Secondary | ICD-10-CM

## 2022-04-13 DIAGNOSIS — I739 Peripheral vascular disease, unspecified: Secondary | ICD-10-CM

## 2022-04-13 MED ORDER — CEPHALEXIN 500 MG PO CAPS: 500.0000 mg | ORAL_CAPSULE | Freq: Three times a day (TID) | ORAL | 0 refills | Status: AC

## 2022-04-13 NOTE — Progress Notes (Signed)
POST OPERATIVE OFFICE NOTE    CC:  F/u for surgery  HPI:  This is a 57 y.o. male who is s/p bilateral revision of below the knee amputations to bilateral above-the-knee amputations by Dr. Stanford Breed on 03/12/2022.  Patient was critically ill with sepsis as well as multi organ failure including renal failure.  Only days after revision to bilateral above-the-knee amputations he began to improve.  He reports bilateral phantom pain however this has become more tolerable.  He denies any fevers, chills, nausea/vomiting.  His wife present states there has been some thick yellow drainage from several areas on both incisions.  He is on aspirin, Plavix, statin daily.  He continues to smoke daily.  No Known Allergies  Current Outpatient Medications  Medication Sig Dispense Refill   aspirin EC 81 MG tablet Take 81 mg by mouth daily.     atorvastatin (LIPITOR) 40 MG tablet Take 40 mg by mouth at bedtime.     carvedilol (COREG) 25 MG tablet Take 25 mg by mouth 2 (two) times daily with a meal.     clopidogrel (PLAVIX) 75 MG tablet Take 75 mg by mouth daily.     DULoxetine (CYMBALTA) 30 MG capsule Take 30 mg by mouth 2 (two) times daily.     gabapentin (NEURONTIN) 400 MG capsule Take 400 mg by mouth 3 (three) times daily.     HYDROcodone-acetaminophen (NORCO) 10-325 MG tablet Take 1 tablet by mouth every 6 (six) hours. 30 tablet 0   HYDROcodone-acetaminophen (NORCO/VICODIN) 5-325 MG tablet Take 1 tablet by mouth 2 times daily at 12 noon and 4 pm. 20 tablet 0   isosorbide dinitrate (ISORDIL) 10 MG tablet Take 10 mg by mouth 3 (three) times daily.     levETIRAcetam (KEPPRA) 500 MG tablet Take 500 mg by mouth 2 (two) times daily.     levocetirizine (XYZAL) 5 MG tablet Take 5 mg by mouth every evening.     meclizine (ANTIVERT) 25 MG tablet Take 25 mg by mouth 3 (three) times daily as needed for dizziness.     MOVANTIK 25 MG TABS tablet Take 25 mg by mouth daily as needed (Constipation).     pantoprazole (PROTONIX)  40 MG tablet Take 40 mg by mouth daily.     pyridOXINE (VITAMIN B6) 100 MG tablet Take 100 mg by mouth daily.     tadalafil (CIALIS) 20 MG tablet Take 20 mg by mouth daily as needed for erectile dysfunction.     tamsulosin (FLOMAX) 0.4 MG CAPS capsule Take 0.4 mg by mouth daily.     thiamine (VITAMIN B1) 100 MG tablet Take 100 mg by mouth daily.     cephALEXin (KEFLEX) 500 MG capsule Take 1 capsule (500 mg total) by mouth 3 (three) times daily for 10 days. 30 capsule 0   ipratropium-albuterol (DUONEB) 0.5-2.5 (3) MG/3ML SOLN Inhale 3 mLs into the lungs every 4 (four) hours as needed (COPD).     No current facility-administered medications for this visit.     ROS:  See HPI  Physical Exam:  Vitals:   04/13/22 1257  BP: 107/69  Pulse: (!) 106  Resp: 20  Temp: 98.6 F (37 C)  TempSrc: Temporal  SpO2: 98%    Incision:  B AKA incisions healing well with some peri-incisional erythremia; areas of superficial wounds and yellow drainage consistent with staple placement Neuro: A&O  Assessment/Plan:  This is a 57 y.o. male who is s/p: Bilateral above-the-knee amputations  -Overall, incisions appear to be  healing well.  Tissue breakdown and redness appears to be consistent with staple placement.  At least every other staple was removed in the office today.  We discussed practicing good hygiene with soap and water at least daily to both incisions and then patting dry.  I will prescribe 10-days of Keflex.  He will follow-up in another 2 weeks for removal of the remainder of the staples.  He knows to call/return office sooner with any questions or concerns.   Dagoberto Ligas, PA-C [facility] [phone]  Clinic MD:  Stanford Breed

## 2022-04-27 ENCOUNTER — Other Ambulatory Visit: Payer: Self-pay

## 2022-04-27 ENCOUNTER — Emergency Department (HOSPITAL_COMMUNITY): Payer: Medicare Other

## 2022-04-27 ENCOUNTER — Encounter: Payer: Self-pay | Admitting: Physician Assistant

## 2022-04-27 ENCOUNTER — Observation Stay (HOSPITAL_COMMUNITY)
Admission: EM | Admit: 2022-04-27 | Discharge: 2022-04-29 | Disposition: A | Discharge status: Home / Self Care | Payer: Medicare Other | Attending: Family Medicine | Admitting: Family Medicine

## 2022-04-27 ENCOUNTER — Observation Stay (HOSPITAL_COMMUNITY): Payer: Medicare Other

## 2022-04-27 ENCOUNTER — Ambulatory Visit (INDEPENDENT_AMBULATORY_CARE_PROVIDER_SITE_OTHER): Payer: Medicare Other | Admitting: Physician Assistant

## 2022-04-27 ENCOUNTER — Other Ambulatory Visit (HOSPITAL_COMMUNITY): Payer: Medicare Other

## 2022-04-27 ENCOUNTER — Encounter (HOSPITAL_COMMUNITY): Payer: Self-pay

## 2022-04-27 VITALS — BP 132/80 | HR 86 | Temp 97.7°F

## 2022-04-27 DIAGNOSIS — A4189 Other specified sepsis: Principal | ICD-10-CM | POA: Insufficient documentation

## 2022-04-27 DIAGNOSIS — F1290 Cannabis use, unspecified, uncomplicated: Secondary | ICD-10-CM | POA: Diagnosis present

## 2022-04-27 DIAGNOSIS — Z89611 Acquired absence of right leg above knee: Secondary | ICD-10-CM

## 2022-04-27 DIAGNOSIS — L089 Local infection of the skin and subcutaneous tissue, unspecified: Principal | ICD-10-CM

## 2022-04-27 DIAGNOSIS — G8929 Other chronic pain: Secondary | ICD-10-CM | POA: Diagnosis not present

## 2022-04-27 DIAGNOSIS — F32A Depression, unspecified: Secondary | ICD-10-CM | POA: Diagnosis not present

## 2022-04-27 DIAGNOSIS — D649 Anemia, unspecified: Secondary | ICD-10-CM | POA: Diagnosis not present

## 2022-04-27 DIAGNOSIS — L03116 Cellulitis of left lower limb: Secondary | ICD-10-CM | POA: Diagnosis present

## 2022-04-27 DIAGNOSIS — L03115 Cellulitis of right lower limb: Secondary | ICD-10-CM | POA: Diagnosis present

## 2022-04-27 DIAGNOSIS — E785 Hyperlipidemia, unspecified: Secondary | ICD-10-CM | POA: Diagnosis present

## 2022-04-27 DIAGNOSIS — N400 Enlarged prostate without lower urinary tract symptoms: Secondary | ICD-10-CM | POA: Diagnosis present

## 2022-04-27 DIAGNOSIS — I2510 Atherosclerotic heart disease of native coronary artery without angina pectoris: Secondary | ICD-10-CM | POA: Diagnosis present

## 2022-04-27 DIAGNOSIS — I5032 Chronic diastolic (congestive) heart failure: Secondary | ICD-10-CM | POA: Diagnosis present

## 2022-04-27 DIAGNOSIS — A419 Sepsis, unspecified organism: Secondary | ICD-10-CM | POA: Diagnosis present

## 2022-04-27 DIAGNOSIS — T8743 Infection of amputation stump, right lower extremity: Secondary | ICD-10-CM | POA: Diagnosis present

## 2022-04-27 DIAGNOSIS — Z79899 Other long term (current) drug therapy: Secondary | ICD-10-CM

## 2022-04-27 DIAGNOSIS — I739 Peripheral vascular disease, unspecified: Secondary | ICD-10-CM | POA: Diagnosis present

## 2022-04-27 DIAGNOSIS — I509 Heart failure, unspecified: Secondary | ICD-10-CM | POA: Diagnosis not present

## 2022-04-27 DIAGNOSIS — T8149XA Infection following a procedure, other surgical site, initial encounter: Secondary | ICD-10-CM | POA: Diagnosis not present

## 2022-04-27 DIAGNOSIS — J449 Chronic obstructive pulmonary disease, unspecified: Secondary | ICD-10-CM | POA: Diagnosis present

## 2022-04-27 DIAGNOSIS — Z7982 Long term (current) use of aspirin: Secondary | ICD-10-CM

## 2022-04-27 DIAGNOSIS — E861 Hypovolemia: Secondary | ICD-10-CM | POA: Diagnosis present

## 2022-04-27 DIAGNOSIS — Y835 Amputation of limb(s) as the cause of abnormal reaction of the patient, or of later complication, without mention of misadventure at the time of the procedure: Secondary | ICD-10-CM | POA: Diagnosis present

## 2022-04-27 DIAGNOSIS — R6521 Severe sepsis with septic shock: Secondary | ICD-10-CM | POA: Diagnosis not present

## 2022-04-27 DIAGNOSIS — G629 Polyneuropathy, unspecified: Secondary | ICD-10-CM | POA: Diagnosis present

## 2022-04-27 DIAGNOSIS — Z96642 Presence of left artificial hip joint: Secondary | ICD-10-CM | POA: Diagnosis present

## 2022-04-27 DIAGNOSIS — K219 Gastro-esophageal reflux disease without esophagitis: Secondary | ICD-10-CM | POA: Diagnosis not present

## 2022-04-27 DIAGNOSIS — T148XXA Other injury of unspecified body region, initial encounter: Secondary | ICD-10-CM | POA: Diagnosis present

## 2022-04-27 DIAGNOSIS — T8744 Infection of amputation stump, left lower extremity: Principal | ICD-10-CM | POA: Insufficient documentation

## 2022-04-27 DIAGNOSIS — Z7902 Long term (current) use of antithrombotics/antiplatelets: Secondary | ICD-10-CM

## 2022-04-27 DIAGNOSIS — G40909 Epilepsy, unspecified, not intractable, without status epilepticus: Secondary | ICD-10-CM | POA: Diagnosis present

## 2022-04-27 DIAGNOSIS — F17210 Nicotine dependence, cigarettes, uncomplicated: Secondary | ICD-10-CM | POA: Diagnosis not present

## 2022-04-27 DIAGNOSIS — Z993 Dependence on wheelchair: Secondary | ICD-10-CM

## 2022-04-27 DIAGNOSIS — Z8673 Personal history of transient ischemic attack (TIA), and cerebral infarction without residual deficits: Secondary | ICD-10-CM

## 2022-04-27 DIAGNOSIS — F17200 Nicotine dependence, unspecified, uncomplicated: Secondary | ICD-10-CM | POA: Insufficient documentation

## 2022-04-27 DIAGNOSIS — Z89612 Acquired absence of left leg above knee: Secondary | ICD-10-CM

## 2022-04-27 DIAGNOSIS — I110 Hypertensive heart disease with heart failure: Secondary | ICD-10-CM | POA: Diagnosis present

## 2022-04-27 DIAGNOSIS — X58XXXA Exposure to other specified factors, initial encounter: Secondary | ICD-10-CM | POA: Insufficient documentation

## 2022-04-27 LAB — CBC WITH DIFFERENTIAL/PLATELET
Abs Immature Granulocytes: 0.69 10*3/uL — ABNORMAL HIGH (ref 0.00–0.07)
Basophils Absolute: 0.1 10*3/uL (ref 0.0–0.1)
Basophils Relative: 0 %
Eosinophils Absolute: 0 10*3/uL (ref 0.0–0.5)
Eosinophils Relative: 0 %
HCT: 24.8 % — ABNORMAL LOW (ref 39.0–52.0)
Hemoglobin: 7.2 g/dL — ABNORMAL LOW (ref 13.0–17.0)
Immature Granulocytes: 3 %
Lymphocytes Relative: 3 %
Lymphs Abs: 0.8 10*3/uL (ref 0.7–4.0)
MCH: 25.7 pg — ABNORMAL LOW (ref 26.0–34.0)
MCHC: 29 g/dL — ABNORMAL LOW (ref 30.0–36.0)
MCV: 88.6 fL (ref 80.0–100.0)
Monocytes Absolute: 0.9 10*3/uL (ref 0.1–1.0)
Monocytes Relative: 4 %
Neutro Abs: 22 10*3/uL — ABNORMAL HIGH (ref 1.7–7.7)
Neutrophils Relative %: 90 %
Platelets: 878 10*3/uL — ABNORMAL HIGH (ref 150–400)
RBC: 2.8 MIL/uL — ABNORMAL LOW (ref 4.22–5.81)
RDW: 18 % — ABNORMAL HIGH (ref 11.5–15.5)
WBC: 24.5 10*3/uL — ABNORMAL HIGH (ref 4.0–10.5)
nRBC: 0.1 % (ref 0.0–0.2)

## 2022-04-27 LAB — COMPREHENSIVE METABOLIC PANEL
ALT: 7 U/L (ref 0–44)
AST: 10 U/L — ABNORMAL LOW (ref 15–41)
Albumin: 1.6 g/dL — ABNORMAL LOW (ref 3.5–5.0)
Alkaline Phosphatase: 111 U/L (ref 38–126)
Anion gap: 10 (ref 5–15)
BUN: 31 mg/dL — ABNORMAL HIGH (ref 6–20)
CO2: 16 mmol/L — ABNORMAL LOW (ref 22–32)
Calcium: 8.4 mg/dL — ABNORMAL LOW (ref 8.9–10.3)
Chloride: 107 mmol/L (ref 98–111)
Creatinine, Ser: 1.03 mg/dL (ref 0.61–1.24)
GFR, Estimated: >60 mL/min (ref >60)
Glucose, Bld: 108 mg/dL — ABNORMAL HIGH (ref 70–99)
Potassium: 4.2 mmol/L (ref 3.5–5.1)
Sodium: 133 mmol/L — ABNORMAL LOW (ref 135–145)
Total Bilirubin: 0.2 mg/dL — ABNORMAL LOW (ref 0.3–1.2)
Total Protein: 6.3 g/dL — ABNORMAL LOW (ref 6.5–8.1)

## 2022-04-27 LAB — TSH: TSH: 1.127 u[IU]/mL (ref 0.350–4.500)

## 2022-04-27 LAB — PREPARE RBC (CROSSMATCH)

## 2022-04-27 LAB — PROTIME-INR
INR: 1.4 — ABNORMAL HIGH (ref 0.8–1.2)
Prothrombin Time: 16.7 seconds — ABNORMAL HIGH (ref 11.4–15.2)

## 2022-04-27 LAB — LACTIC ACID, PLASMA: Lactic Acid, Venous: 0.9 mmol/L (ref 0.5–1.9)

## 2022-04-27 MED ORDER — LACTATED RINGERS IV SOLN: INTRAVENOUS | Status: AC

## 2022-04-27 MED ORDER — ENOXAPARIN SODIUM 40 MG/0.4ML IJ SOSY: 40.0000 mg | PREFILLED_SYRINGE | INTRAMUSCULAR | Status: DC

## 2022-04-27 MED ORDER — NICOTINE 21 MG/24HR TD PT24
21.0000 mg | MEDICATED_PATCH | Freq: Every day | TRANSDERMAL | Status: DC
  Administered 2022-04-28 – 2022-04-29 (×3): 21 mg via TRANSDERMAL
  Filled 2022-04-27 (×3): qty 1

## 2022-04-27 MED ORDER — LORATADINE 10 MG PO TABS
10.0000 mg | ORAL_TABLET | Freq: Every evening | ORAL | Status: DC
  Administered 2022-04-27: 10 mg via ORAL
  Filled 2022-04-27 (×2): qty 1

## 2022-04-27 MED ORDER — SODIUM CHLORIDE 0.9 % IV SOLN
2.0000 g | Freq: Three times a day (TID) | INTRAVENOUS | Status: DC
  Administered 2022-04-28 – 2022-04-29 (×5): 2 g via INTRAVENOUS
  Filled 2022-04-27 (×5): qty 12.5

## 2022-04-27 MED ORDER — NOREPINEPHRINE 4 MG/250ML-% IV SOLN
0.0000 ug/min | INTRAVENOUS | Status: DC
  Administered 2022-04-27: 2 ug/min via INTRAVENOUS
  Filled 2022-04-27: qty 250

## 2022-04-27 MED ORDER — ASPIRIN 81 MG PO TBEC
81.0000 mg | DELAYED_RELEASE_TABLET | Freq: Every day | ORAL | Status: DC
  Administered 2022-04-28 – 2022-04-29 (×2): 81 mg via ORAL
  Filled 2022-04-27 (×2): qty 1

## 2022-04-27 MED ORDER — GABAPENTIN 400 MG PO CAPS
400.0000 mg | ORAL_CAPSULE | Freq: Three times a day (TID) | ORAL | Status: DC
  Administered 2022-04-27 – 2022-04-29 (×5): 400 mg via ORAL
  Filled 2022-04-27 (×5): qty 1

## 2022-04-27 MED ORDER — METRONIDAZOLE 500 MG/100ML IV SOLN
500.0000 mg | Freq: Once | INTRAVENOUS | Status: AC
  Administered 2022-04-27: 500 mg via INTRAVENOUS
  Filled 2022-04-27: qty 100

## 2022-04-27 MED ORDER — ATORVASTATIN CALCIUM 40 MG PO TABS
40.0000 mg | ORAL_TABLET | Freq: Every day | ORAL | Status: DC
  Administered 2022-04-27 – 2022-04-28 (×2): 40 mg via ORAL
  Filled 2022-04-27 (×2): qty 1

## 2022-04-27 MED ORDER — LEVETIRACETAM 500 MG PO TABS
500.0000 mg | ORAL_TABLET | Freq: Two times a day (BID) | ORAL | Status: DC
  Administered 2022-04-27 – 2022-04-29 (×4): 500 mg via ORAL
  Filled 2022-04-27 (×4): qty 1

## 2022-04-27 MED ORDER — VANCOMYCIN HCL IN DEXTROSE 1-5 GM/200ML-% IV SOLN
1000.0000 mg | Freq: Once | INTRAVENOUS | Status: AC
  Administered 2022-04-27: 1000 mg via INTRAVENOUS
  Filled 2022-04-27: qty 200

## 2022-04-27 MED ORDER — LACTATED RINGERS IV BOLUS
1000.0000 mL | Freq: Once | INTRAVENOUS | Status: AC
  Administered 2022-04-27: 1000 mL via INTRAVENOUS

## 2022-04-27 MED ORDER — CLOPIDOGREL BISULFATE 75 MG PO TABS
75.0000 mg | ORAL_TABLET | Freq: Every day | ORAL | Status: DC
  Administered 2022-04-28 – 2022-04-29 (×2): 75 mg via ORAL
  Filled 2022-04-27 (×2): qty 1

## 2022-04-27 MED ORDER — IPRATROPIUM-ALBUTEROL 0.5-2.5 (3) MG/3ML IN SOLN: 3.0000 mL | RESPIRATORY_TRACT | Status: DC | PRN

## 2022-04-27 MED ORDER — VANCOMYCIN HCL IN DEXTROSE 1-5 GM/200ML-% IV SOLN
1000.0000 mg | INTRAVENOUS | Status: DC
  Filled 2022-04-27: qty 200

## 2022-04-27 MED ORDER — SODIUM CHLORIDE 0.9 % IV SOLN: 2.0000 g | Freq: Once | INTRAVENOUS | Status: DC

## 2022-04-27 MED ORDER — HYDROMORPHONE HCL 1 MG/ML IJ SOLN
0.5000 mg | INTRAMUSCULAR | Status: DC | PRN
  Administered 2022-04-27 – 2022-04-28 (×3): 0.5 mg via INTRAVENOUS
  Filled 2022-04-27 (×3): qty 1

## 2022-04-27 MED ORDER — SODIUM CHLORIDE 0.9% IV SOLUTION: Freq: Once | INTRAVENOUS | Status: DC

## 2022-04-27 MED ORDER — ALBUMIN HUMAN 25 % IV SOLN
25.0000 g | Freq: Four times a day (QID) | INTRAVENOUS | Status: AC
  Administered 2022-04-27 – 2022-04-28 (×4): 25 g via INTRAVENOUS
  Filled 2022-04-27 (×3): qty 100

## 2022-04-27 MED ORDER — PANTOPRAZOLE SODIUM 40 MG PO TBEC
40.0000 mg | DELAYED_RELEASE_TABLET | Freq: Every day | ORAL | Status: DC
  Administered 2022-04-28 – 2022-04-29 (×2): 40 mg via ORAL
  Filled 2022-04-27 (×2): qty 1

## 2022-04-27 NOTE — ED Notes (Signed)
EDP is fully aware of hypotension for this pt.  Multiple attempts have been made to start IV's by myself, the EDP and the IV team.  Only one IV has been started successfully so far.  Pt has had good mentation the whole time but he does look a little better after some fluids despite his BP.

## 2022-04-27 NOTE — ED Triage Notes (Signed)
Pt c/o infection of stumps bilat. Pt states had surg 1/4. Pt has a couple dehisced area on right stump. Pt's left stump incision is dehisced on left stump, erythematous, pt has purulent drainage coming from the incision.

## 2022-04-27 NOTE — H&P (Addendum)
Hospital Admission History and Physical Service Pager: [phone]  Patient name: [name] Medical record number: [id number] Date of Birth: [birth year] Age: 57 y.o. Gender: male  Primary Care Provider: Lorelee Market, MD Consultants: CCM, VVS Code Status: Full code Preferred Emergency Contact:  Contact Information     Name Relation Home Work Mobile   [name] Spouse   [phone]   [name]  [phone]          Chief Complaint:   Assessment and Plan: [name] is a 57 y.o. male presenting with sepsis most likely secondary to bilateral AKA site infections. Differential for this patient's sepsis includes urinary source (denies dysuria), pulmonary source (CXR showed possible consolidation but denies respiratory symptoms).  * Sepsis ([place]) Presented with hypotensive, leukocytosis, tachycardia meeting SIRS criteria with most likely sources as bilateral AKA sites with purulent drainage and erythema x 1 week. Hypotensive with BP 70-80s requiring Norepi and initially admitted to the ICU but BP stabilized with 1L LR bolus x 3, MIVF and albumin. Started on broad spectrum abx Cefepime/Vanc/Flagyl and VVS was consulted. -Admitted to [facility]MT[facility] progressive care, Dr. Erin Brennan attending -VVS consulted, appreciate recs -CT LE without contrast -Continue broad spectrum abx pending source control -MIVF -NPO pending potential surgical intervention -Pain control: IV Dilaudid, transition to home regimen when taking PO -Hold home ASA and plavix until post-op -F/u blood culture -Close BP monitoring -Trend leukocytosis  Anemia Hgb 7.2 upon admission (baseline ~8-9). H/o CAD. -1u pRBC transfusion -F/u post-transfusion H&H -Iron studies in AM -Transfusion threshold <8  Tobacco use disorder Currently smoke around 1/2 pack per day x 20-25 year. -Nicotine patch 21mg   CHF (congestive heart failure) ([place]) Appears euvolemic upon exam. Most recent Echo in 2021 with  EF 55-60%. -Consider repeat Echo    Chronic stable conditions: Seizures: continue Keppra 500mg  BID when taking PO GERD: continue Protonix when taking PO Depression: continue Duloxetine 30mg  BID when taking PO  FEN/GI: NPO VTE Prophylaxis: Holding until post-op  Disposition: Progressive  History of Present Illness:  [name] is a 57 y.o. male presenting with purulent drainage from both his AKAs in the last week. Endorses worsening redness and swelling (although fees his R leg has always looked bigger). Associated with intense pain that his home Norco has not helped with. Endorses some nausea and decreased appetite for the past couple of days. Has been drinking apple juice and water. Denies fever, dysuria, CP, cough and sputum production. Does endorse SOB chronically but not acutely worsening. Denies taking medications today since he was just sent over from his appointment this AM and they live 2 hours away.  In the ED, patient initially hypotensive requiring norepi and admitted to the ICU. Received 1L LR bolus x 3 and albumin with improvement in BP. He was started on broad spectrum antibiotics including Vanc/Cefepime/Flagyl and VVS was consulted for potential operative management.  Review Of Systems: Per HPI above  Pertinent Past Medical History: Bilateral AKA COPD HTN Seizures H/o CVA Anemia CHF CAD Depression PVD Remainder reviewed in history tab.   Pertinent Past Surgical History: L hip replacement Bilateral AKA IVC filer Remainder reviewed in history tab.   Pertinent Social History: Tobacco use: Current smoker - now 1/2 ppd for 20ish years Alcohol use: None Other Substance use: Marijuana daily  Lives with wife and mother  Pertinent Family History: None pertinent Remainder reviewed in history tab.   Important Outpatient Medications: ASA 81mg  daily Lipitor 40mg  daily Coreg 25mg  BID Plavix 75mg  daily  Duloxetine 30mg  BID Gabapentin 400mg  TID Norco  10-325 q6h Isordil 10mg  TID Keppra 500mg  BID Levocetrizine 5mg  daily Protonix 40mg  daily Flomax 0.4mg  daily Duonebs prn Remainder reviewed in medication history.   Objective: BP 109/63   Pulse 98   Temp 98.2 F (36.8 C) (Oral)   Resp 10   Ht 5\' 10"  (1.778 m)   Wt 52.4 kg   SpO2 95%   BMI 16.58 kg/m  Exam: General: Older than stated age, pale, alert, NAD Eyes: PERRLA, anicteric sclera ENTM: Moist mucus membranes. Neck: Supple, non-tender Cardiovascular: RRR without murmur Respiratory: Mildly coarse breath sounds. Normal WOB on RA Gastrointestinal: Soft, non-tender, non-distended MSK: Bilateral AKA with purulent drainage (L>R) and surrounding erythema. Multiple areas of scabbing. Motor and sensation of both legs intact. Derm: Warm, dry Neuro: CN intact. Motor and sensation intact globally. Psych: Cooperative, fatigued  Labs:  CBC BMET  Recent Labs  Lab 04/27/22 1158  WBC 24.5*  HGB 7.2*  HCT 24.8*  PLT 878*   Recent Labs  Lab 04/27/22 1158  NA 133*  K 4.2  CL 107  CO2 16*  BUN 31*  CREATININE 1.03  GLUCOSE 108*  CALCIUM 8.4*    Pertinent additional labs: TSH: 1.127 Lactic acid: 0.9 INR: 1.4  EKG: NSR   Imaging Studies Performed: DG Chest Port 1 View Result Date: 04/27/2022 IMPRESSION: Nonspecific interstitial prominence consistent with atypical infection, edema or interstitial lung disease. No focal consolidation.    Paul Maryland, MD 04/27/2022, 6:51 PM PGY-1, [place] Intern pager: [phone], text pages welcome Secure chat group [place] Upper-Level Resident Addendum   I have independently interviewed and examined the patient. I have discussed the above with the original author and agree with their documentation. My edits for correction/addition/clarification are included. Please see also any attending notes.   [name], D.O. PGY-3, [facility] 04/27/2022 7:02 PM  [place] Service pager: [phone] (text pages welcome through [facility])

## 2022-04-27 NOTE — Sepsis Progress Note (Signed)
Elink monitoring for the code sepsis protocol.  

## 2022-04-27 NOTE — Assessment & Plan Note (Addendum)
Post H&H 8.0. No reported bleeding or blood in stools. Baseline ~8-9. Iron studies consistent with IDA, would benefit from oral supplementation OP. -CBC in AM -Consider oral iron on discharge -Transfusion threshold <8

## 2022-04-27 NOTE — Consult Note (Signed)
NAME:  [name], MRN:  [id number], DOB:  1965/11/22, LOS: 0 ADMISSION DATE:  04/27/2022, CONSULTATION DATE:  04/27/22 REFERRING MD:  EDP, CHIEF COMPLAINT:  sepsis  HISTORY OF PRESENT ILLNESS   [name] is 57yo male with history of peripheral vascular disease s/p bilateral BKA's d/t failed femoral bypass, requiring revision to AKA's in February 2024, chronic kidney disease previously requiring dialysis, chronic diastolic heart failure, COPD, GERD, hypertension, depression presenting today from vascular surgeon office with purulent wounds. A few weeks ago patient followed up with vascular surgery and was found to have thick yellow drainage and some erythema around the sites. At that time he was prescribed 10d course of Keflex. Over the last few days patient has felt weaker overall, mentioning some increased pain in his legs bilaterally.  Has noticed some increase in purulence in the left lower extremity wound over the last few days as well. His wife typically takes his blood pressures daily and recently his systolics have been in the 110's. He denies any fevers, chills, lack of appetite, nausea, vomiting, dyspnea, abdominal pain. Today he had a visit with the vascular surgeon who suggested going to the ER given the wounds. Vascular surgery has been consulted.  SIGNIFICANT PAST MEDICAL HISTORY   Peripheral vascular disease s/p bilateral BKA's to AKA's 03/2022 COPD Chronic diastolic heart failure Tobacco use GERD Hypertension Depression  SIGNIFICANT EVENTS:  3/19 - admitted to [facility]  STUDIES:   N/a  CULTURES:  [place] 3/19 - pending  ANTIBIOTICS:  Vanc, cefepime, flagyl 3/19 >  LINES/TUBES:  PIV  CONSULTANTS:  Vascular surgery  CONSTITUTIONAL: BP (!) 77/45   Pulse 96   Temp 97.7 F (36.5 C) (Oral)   Resp 10   Ht 5\' 10"  (1.778 m)   Wt 52.4 kg   SpO2 100%   BMI 16.58 kg/m   No intake/output data recorded.  PHYSICAL EXAM: General:  Ill-appearing person laying in  bed in no acute distress Neuro:  Awake, alert, conversing appropriately. Grossly non-focal.  HEENT:  [place]/AT. Dry mucous membranes. No cervical lymphadenopathy.  Cardiovascular:  Regular rate, rhythm. No murmurs appreciated. Radial pulses 2+ bilaterally.  Lungs:  Normal work of breathing on room air. Clear to auscultation bilaterally.  Abdomen:  Soft, non-tender, non-distended. Normoactive bowel sounds.  Musculoskeletal:  S/p bilateral AKA. No peripheral edema. Skin:  Pale, warm, dry. Bilateral wounds with dehiscence, surrounding erythema with mild purulence.        RESOLVED PROBLEM LIST   ASSESSMENT AND PLAN   #Severe sepsis likely 2/2 bilateral wound infections s/p bilateral AKA 03/2022 #Peripheral vascular disease Patient initially normotensive in office this morning, BP has steadily dropped through the day. Thus far, patient has only received 1L LR bolus and was started on maintenance fluids. We will continue fluid boluses for now prior to initiating vasopressors. Will try further fluid boluses prior to accepting ICU admission. Renal function okay, lactate normal. Broad-spectrum antibiotics have been initiated. Vascular surgery has been consulted, likely will need washout of the wounds with them, appreciate their assistance.  - IV fluid boluses for 30cc/kg followed by albumin - Broad-spectrum antibiotics with vanc, cefepime, flagyl - Follow-up vascular surgery recommendations - Continue NPO until surgical plan has been decided - IV dilaudid for pain control, would transition to orals once can take PO - Hold home ASA and plavix until post-op - Follow-up blood cultures  #Acute on chronic anemia Hgb 7.2 on arrival, down from 8.3 previously. Will check iron panels, possibly could use  IV iron at some point. No evidence of acute bleeding at this time.  - Follow-up iron studies - Daily CBC - Transfuse Hgb <7  #Chronic diastolic heart failure  Appears hypovolemic, holding home  anti-hypertensive.  #COPD Uses DuoNeb as needed, lungs sound clear on exam - DuoNeb q4h PRN  #Seizure disorder Re-start home Keppra post-operatively.  #GERD Re-start home pantoprazole post-operatively.  Best Practice / Goals of Care / Disposition.   DVT PROPHYLAXIS:holding until post-op NUTRITION:NPO until post-op MOBILITY: Bedrest FAMILY DISCUSSIONS: discussed with mother and wife at bedside 3/19 DISPOSITION Pending  LABS  Glucose No results for input(s): "GLUCAP" in the last 168 hours.  BMET Recent Labs  Lab 04/27/22 1158  NA 133*  K 4.2  CL 107  CO2 16*  BUN 31*  CREATININE 1.03  GLUCOSE 108*    Liver Enzymes Recent Labs  Lab 04/27/22 1158  AST 10*  ALT 7  ALKPHOS 111  BILITOT 0.2*  ALBUMIN 1.6*    Electrolytes Recent Labs  Lab 04/27/22 1158  CALCIUM 8.4*    CBC Recent Labs  Lab 04/27/22 1158  WBC 24.5*  HGB 7.2*  HCT 24.8*  PLT 878*    ABG No results for input(s): "PHART", "PCO2ART", "PO2ART" in the last 168 hours.  Coag's Recent Labs  Lab 04/27/22 1158  INR 1.4*    Sepsis Markers Recent Labs  Lab 04/27/22 1158  LATICACIDVEN 0.9    Cardiac Enzymes No results for input(s): "TROPONINI", "PROBNP" in the last 168 hours.  PAST MEDICAL HISTORY :   He  has a past medical history of Anemia, CHF (congestive heart failure) ([place]), COPD (chronic obstructive pulmonary disease) ([place]), Coronary artery disease, Depression, GERD (gastroesophageal reflux disease), Hypertension, Neuropathy (03/12/2022), Peripheral vascular disease ([place]), Seizures ([place]), and Wheelchair dependent.  PAST SURGICAL HISTORY:  He  has a past surgical history that includes Vascular surgery; Vena Cava Filter Placement (N/A, 12/31/2012); bilateral below knee amputation (Bilateral); Amputation (Bilateral, 99991111); and Application if wound vac (Bilateral, 03/12/2022).  No Known Allergies  No current facility-administered medications on file prior to encounter.    Current Outpatient Medications on File Prior to Encounter  Medication Sig   aspirin EC 81 MG tablet Take 81 mg by mouth daily.   atorvastatin (LIPITOR) 40 MG tablet Take 40 mg by mouth at bedtime.   carvedilol (COREG) 25 MG tablet Take 25 mg by mouth 2 (two) times daily with a meal.   clopidogrel (PLAVIX) 75 MG tablet Take 75 mg by mouth daily.   DULoxetine (CYMBALTA) 30 MG capsule Take 30 mg by mouth 2 (two) times daily.   gabapentin (NEURONTIN) 400 MG capsule Take 400 mg by mouth 3 (three) times daily.   HYDROcodone-acetaminophen (NORCO) 10-325 MG tablet Take 1 tablet by mouth every 6 (six) hours.   HYDROcodone-acetaminophen (NORCO/VICODIN) 5-325 MG tablet Take 1 tablet by mouth 2 times daily at 12 noon and 4 pm.   ipratropium-albuterol (DUONEB) 0.5-2.5 (3) MG/3ML SOLN Inhale 3 mLs into the lungs every 4 (four) hours as needed (COPD).   isosorbide dinitrate (ISORDIL) 10 MG tablet Take 10 mg by mouth 3 (three) times daily.   levETIRAcetam (KEPPRA) 500 MG tablet Take 500 mg by mouth 2 (two) times daily.   levocetirizine (XYZAL) 5 MG tablet Take 5 mg by mouth every evening.   meclizine (ANTIVERT) 25 MG tablet Take 25 mg by mouth 3 (three) times daily as needed for dizziness.   MOVANTIK 25 MG TABS tablet Take 25 mg by mouth daily as  needed (Constipation).   pantoprazole (PROTONIX) 40 MG tablet Take 40 mg by mouth daily.   pyridOXINE (VITAMIN B6) 100 MG tablet Take 100 mg by mouth daily.   tadalafil (CIALIS) 20 MG tablet Take 20 mg by mouth daily as needed for erectile dysfunction.   tamsulosin (FLOMAX) 0.4 MG CAPS capsule Take 0.4 mg by mouth daily.   thiamine (VITAMIN B1) 100 MG tablet Take 100 mg by mouth daily.    FAMILY HISTORY:   His family history is not on file.  SOCIAL HISTORY:  He  reports that he has been smoking cigarettes. He has a 30.00 pack-year smoking history. He has been exposed to tobacco smoke. He has never used smokeless tobacco. He reports that he does not currently  use alcohol. He reports current drug use. Drug: Marijuana.  REVIEW OF SYSTEMS:    Per HPI  Sanjuan Dame, MD Internal Medicine PGY-3 Pager: [phone]

## 2022-04-27 NOTE — ED Notes (Signed)
Bloodwork, fluids and ABX delayed d/t difficult IV access

## 2022-04-27 NOTE — Assessment & Plan Note (Addendum)
Presented with hypotensive, leukocytosis, tachycardia meeting SIRS criteria with most likely sources as bilateral AKA sites with purulent drainage and erythema x 1 week. Hypotensive with BP 70-80s requiring Norepi and initially admitted to the ICU but BP stabilized with 1L LR bolus x 3, MIVF and albumin. Started on broad spectrum abx Cefepime/Vanc/Flagyl and VVS was consulted. -Admitted to FMTS progressive care, Dr. Erin Hearing attending -VVS consulted, appreciate recs -CT LE without contrast -Continue broad spectrum abx pending source control -MIVF -NPO pending potential surgical intervention -Pain control: IV Dilaudid, transition to home regimen when taking PO -Hold home ASA and plavix until post-op -F/u blood culture -Close BP monitoring -Trend leukocytosis

## 2022-04-27 NOTE — Progress Notes (Signed)
FMTS Brief Progress Note  S:Went bedside to see patient. Patient there with family, complaining of some leg pain, but family member report's he'd just received some pain meds. Patient otherwise had not complaints and was mentating well.    O: BP 118/73   Pulse 92   Temp 97.7 F (36.5 C) (Oral)   Resp 18   Ht 5\' 10"  (1.778 m)   Wt 43.1 kg   SpO2 95%   BMI 13.63 kg/m   General: NAD Extremities: mild erythema w/ yellow drainage bilaterally and AKA, left worse than right  A/P: Sepsis Patient brought in septic, s/p IVF bolus x 3 w/ albumin and levophed, off of levophed and continued on IVF, BP stablized. Patient was started on broad spectrum abx. VVS has been consulted, will continue abx and possible plan for clean out/revision in future. Will keep close eye on BP in case patient needing more support/can re consult ICU.  - Continue Vanc/Cefepime/Flagyl - Plans per day team  Anemia Hgb 7.2 on admission w/ Baseline 8-9, H/o CAD w/ transfusion threshold of 8 -Transfuse 1 u PRBC -Post transfusion h/h  - Orders reviewed. Labs for AM ordered, which was adjusted as needed.   Holley Bouche, MD 04/27/2022, 8:18 PM PGY-2, [place] Night Resident  Please page [phone] with questions.

## 2022-04-27 NOTE — Assessment & Plan Note (Signed)
Appears euvolemic upon exam. Most recent Echo in 2021 with EF 55-60%. -Holding home Isordil 10mg  TID and Coreg 25mg  BID in the setting of soft BPs

## 2022-04-27 NOTE — ED Notes (Signed)
ED TO INPATIENT HANDOFF REPORT  ED Nurse Name and Phone #: Caryl Pina RN [phone]  S Name/Age/Gender [name] 57 y.o. male Room/Bed: 019C/019C  Code Status   Code Status: Full Code  Home/SNF/Other Home Patient oriented to: self, place, time, and situation Is this baseline? Yes   Triage Complete: Triage complete  Chief Complaint Sepsis [facility]) [A41.9]  Triage Note Pt c/o infection of stumps bilat. Pt states had surg 1/4. Pt has a couple dehisced area on right stump. Pt's left stump incision is dehisced on left stump, erythematous, pt has purulent drainage coming from the incision.    Allergies No Known Allergies  Level of Care/Admitting Diagnosis ED Disposition     ED Disposition  Admit   Condition  --   Comment  Hospital Area: [place] [[id number]]  Level of Care: Progressive [102]  Admit to Progressive based on following criteria: MULTISYSTEM THREATS such as stable sepsis, metabolic/electrolyte imbalance with or without encephalopathy that is responding to early treatment.  May place patient in observation at [facility] or [place] if equivalent level of care is available:: No  Covid Evaluation: Asymptomatic - no recent exposure (last 10 days) testing not required  Diagnosis: Sepsis [facility]) [id number]  Admitting Physician: Lenoria Chime [id number]  Attending Physician: Lenoria Chime [id number]          B Medical/Surgery History Past Medical History:  Diagnosis Date   Anemia    CHF (congestive heart failure) ([place])    COPD (chronic obstructive pulmonary disease) ([place])    Coronary artery disease    Depression    GERD (gastroesophageal reflux disease)    Hypertension    Neuropathy 03/12/2022   Peripheral vascular disease ([place])    Seizures ([place])    last one was "years ago"   Wheelchair dependent    Past Surgical History:  Procedure Laterality Date   AMPUTATION Bilateral 03/12/2022   Procedure: AMPUTATION ABOVE KNEE;  Surgeon:  Cherre Robins, MD;  Location: [place];  Service: Vascular;  Laterality: Bilateral;   APPLICATION OF WOUND VAC Bilateral 03/12/2022   Procedure: APPLICATION OF WOUND VAC;  Surgeon: Cherre Robins, MD;  Location: [place];  Service: Vascular;  Laterality: Bilateral;   bilateral below knee amputation Bilateral    x 2   VASCULAR SURGERY     VENA CAVA FILTER PLACEMENT N/A 12/31/2012   Procedure: INSERTION VENA-CAVA FILTER Femoral approach;  Surgeon: Angelia Mould, MD;  Location: [facility] OR;  Service: Vascular;  Laterality: N/A;     A IV Location/Drains/Wounds Patient Lines/Drains/Airways Status     Active Line/Drains/Airways     Name Placement date Placement time Site Days   Peripheral IV 04/27/22 20 G 1.88" Anterior;Left Forearm 04/27/22  1200  Forearm  less than 1   Peripheral IV 04/27/22 20 G Anterior;Proximal;Right Forearm 04/27/22  1600  Forearm  less than 1   Hemodialysis Catheter Right Subclavian --  --  Subclavian  --            Intake/Output Last 24 hours  Intake/Output Summary (Last 24 hours) at 04/27/2022 1828 Last data filed at 04/27/2022 1658 Gross per 24 hour  Intake 2000 ml  Output --  Net 2000 ml    Labs/Imaging Results for orders placed or performed during the hospital encounter of 04/27/22 (from the past 48 hour(s))  Comprehensive metabolic panel     Status: Abnormal   Collection Time: 04/27/22 11:58 AM  Result Value Ref Range   Sodium  133 (L) 135 - 145 mmol/L   Potassium 4.2 3.5 - 5.1 mmol/L   Chloride 107 98 - 111 mmol/L   CO2 16 (L) 22 - 32 mmol/L   Glucose, Bld 108 (H) 70 - 99 mg/dL    Comment: Glucose reference range applies only to samples taken after fasting for at least 8 hours.   BUN 31 (H) 6 - 20 mg/dL   Creatinine, Ser 1.03 0.61 - 1.24 mg/dL   Calcium 8.4 (L) 8.9 - 10.3 mg/dL   Total Protein 6.3 (L) 6.5 - 8.1 g/dL   Albumin 1.6 (L) 3.5 - 5.0 g/dL   AST 10 (L) 15 - 41 U/L   ALT 7 0 - 44 U/L   Alkaline Phosphatase 111 38 - 126 U/L   Total  Bilirubin 0.2 (L) 0.3 - 1.2 mg/dL   GFR, Estimated >60 >60 mL/min    Comment: (NOTE) Calculated using the CKD-EPI Creatinine Equation (2021)    Anion gap 10 5 - 15    Comment: Performed at [facility], [place] [address] [place], [place] [address]  Lactic acid, plasma     Status: None   Collection Time: 04/27/22 11:58 AM  Result Value Ref Range   Lactic Acid, Venous 0.9 0.5 - 1.9 mmol/L    Comment: Performed at [place] [address] [place], [place] [address]  CBC with Differential     Status: Abnormal   Collection Time: 04/27/22 11:58 AM  Result Value Ref Range   WBC 24.5 (H) 4.0 - 10.5 K/uL   RBC 2.80 (L) 4.22 - 5.81 MIL/uL   Hemoglobin 7.2 (L) 13.0 - 17.0 g/dL   HCT 24.8 (L) 39.0 - 52.0 %   MCV 88.6 80.0 - 100.0 fL   MCH 25.7 (L) 26.0 - 34.0 pg   MCHC 29.0 (L) 30.0 - 36.0 g/dL   RDW 18.0 (H) 11.5 - 15.5 %   Platelets 878 (H) 150 - 400 K/uL   nRBC 0.1 0.0 - 0.2 %   Neutrophils Relative % 90 %   Neutro Abs 22.0 (H) 1.7 - 7.7 K/uL   Lymphocytes Relative 3 %   Lymphs Abs 0.8 0.7 - 4.0 K/uL   Monocytes Relative 4 %   Monocytes Absolute 0.9 0.1 - 1.0 K/uL   Eosinophils Relative 0 %   Eosinophils Absolute 0.0 0.0 - 0.5 K/uL   Basophils Relative 0 %   Basophils Absolute 0.1 0.0 - 0.1 K/uL   Immature Granulocytes 3 %   Abs Immature Granulocytes 0.69 (H) 0.00 - 0.07 K/uL    Comment: Performed at [facility], [place] [address] [place], [place] [address]  Protime-INR     Status: Abnormal   Collection Time: 04/27/22 11:58 AM  Result Value Ref Range   Prothrombin Time 16.7 (H) 11.4 - 15.2 seconds   INR 1.4 (H) 0.8 - 1.2    Comment: (NOTE) INR goal varies based on device and disease states. Performed at [facility], [place] [address], [place], [place] [address]   TSH     Status: None   Collection Time: 04/27/22 11:58 AM  Result Value Ref Range   TSH 1.127 0.350 - 4.500 uIU/mL    Comment: Performed by a 3rd Generation assay with a functional  sensitivity of <=0.01 uIU/mL. Performed at [facility], [place] [address] [place], [place] [address]    DG Chest Port 1 View  Result Date: 04/27/2022 CLINICAL DATA:  [id number] Sepsis (  [place]) [id number] EXAM: PORTABLE CHEST - 1 VIEW COMPARISON:  04/08/2006. FINDINGS: The heart size and mediastinal contours are within normal limits. There is diffuse pulmonary interstitial prominence which could be seen with atypical infection, edema or interstitial lung disease. There is no focal consolidation. No pneumothorax or pleural effusion. The visualized osseous structures are unremarkable. IMPRESSION: Nonspecific interstitial prominence consistent with atypical infection, edema or interstitial lung disease. No focal consolidation. Electronically Signed   By: Sammie Bench M.D.   On: 04/27/2022 15:06    Pending Labs Unresulted Labs (From admission, onward)     Start     Ordered   04/28/22 0500  CBC  Tomorrow morning,   R       Question:  Specimen collection method  Answer:  IV Team=IV Team collect   04/27/22 1549   04/28/22 0500  Iron and TIBC  Tomorrow morning,   R       Question:  Specimen collection method  Answer:  IV Team=IV Team collect   04/27/22 1549   04/28/22 0500  Ferritin  Tomorrow morning,   R       Question:  Specimen collection method  Answer:  IV Team=IV Team collect   04/27/22 1549   04/28/22 0500  Renal function panel  Tomorrow morning,   R       Question:  Specimen collection method  Answer:  IV Team=IV Team collect   04/27/22 1549   04/28/22 [id number]  Basic metabolic panel  Tomorrow morning,   R       Question:  Specimen collection method  Answer:  IV Team=IV Team collect   04/27/22 1817   04/28/22 0500  CBC with Differential/Platelet  Tomorrow morning,   R       Question:  Specimen collection method  Answer:  IV Team=IV Team collect   04/27/22 1817   04/27/22 1817  Type and screen [place]  (Blood Administration Adult)  Once,   R       Comments: [place]    04/27/22 1817   04/27/22 1817  Prepare RBC (crossmatch)  (Blood Administration Adult)  Once,   R       Question Answer Comment  # of Units 1 unit   Transfusion Indications Hemoglobin 8 gm/dL or less and orthopedic or cardiac surgery or pre-existing cardiac condition   Number of Units to Keep Ahead NO units ahead   If emergent release call blood bank Not emergent release      04/27/22 1817   04/27/22 1047  Culture, blood (Routine x 2)  BLOOD CULTURE X 2,   R (with STAT occurrences)      04/27/22 1047   04/27/22 1047  Urinalysis, Routine w reflex microscopic -Urine, Clean Catch  Once,   URGENT       Question:  Specimen Source  Answer:  Urine, Clean Catch   04/27/22 1047            Vitals/Pain Today's Vitals   04/27/22 1726 04/27/22 1800 04/27/22 1811 04/27/22 1815  BP: (!) 95/59 98/61  92/67  Pulse: 92 98  93  Resp: 11 16  13   Temp:   98.2 F (36.8 C)   TempSrc:   Oral   SpO2: 98% 98%  97%  Weight:      Height:      PainSc:        Isolation Precautions No active isolations  Medications Medications  lactated ringers infusion ( Intravenous  New Bag/Given 04/27/22 1252)  ceFEPIme (MAXIPIME) 2 g in sodium chloride 0.9 % 100 mL IVPB (0 g Intravenous Hold 04/27/22 1506)  norepinephrine (LEVOPHED) 4mg  in 239mL (0.016 mg/mL) premix infusion (0 mcg/min Intravenous Stopped 04/27/22 1625)  HYDROmorphone (DILAUDID) injection 0.5 mg (has no administration in time range)  albumin human 25 % solution 25 g (25 g Intravenous New Bag/Given 04/27/22 1602)  ceFEPIme (MAXIPIME) 2 g in sodium chloride 0.9 % 100 mL IVPB (has no administration in time range)  vancomycin (VANCOCIN) IVPB 1000 mg/200 mL premix (has no administration in time range)  ipratropium-albuterol (DUONEB) 0.5-2.5 (3) MG/3ML nebulizer solution 3 mL (has no administration in time range)  enoxaparin (LOVENOX) injection 40 mg (has no administration in time range)  nicotine (NICODERM CQ - dosed in mg/24  hours) patch 21 mg (has no administration in time range)  0.9 %  sodium chloride infusion (Manually program via Guardrails IV Fluids) (has no administration in time range)  lactated ringers bolus 1,000 mL (0 mLs Intravenous Stopped 04/27/22 1421)  metroNIDAZOLE (FLAGYL) IVPB 500 mg (0 mg Intravenous Stopped 04/27/22 1505)  vancomycin (VANCOCIN) IVPB 1000 mg/200 mL premix (0 mg Intravenous Stopped 04/27/22 1515)  lactated ringers bolus 1,000 mL (0 mLs Intravenous Stopped 04/27/22 1658)  lactated ringers bolus 1,000 mL (1,000 mLs Intravenous New Bag/Given 04/27/22 1636)    Mobility non-ambulatory     Focused Assessments Cardiac Assessment Handoff:  Cardiac Rhythm: Sinus tachycardia No results found for: "CKTOTAL", "CKMB", "CKMBINDEX", "TROPONINI" No results found for: "DDIMER"    R Recommendations: See Admitting Provider Note  Report given to:   Additional Notes: Bilateral amputee, wounds to bilateral stumps

## 2022-04-27 NOTE — ED Notes (Signed)
Vanc and Flagyl prioritized over cefepime d/t need for LR for BP support and compatibility considerations.

## 2022-04-27 NOTE — Consult Note (Addendum)
Hospital Consult  VASCULAR SURGERY ASSESSMENT & PLAN:   BILATERAL ABOVE-THE-KNEE AMPUTATION WOUNDS: This patient had bilateral below the knee amputations many years ago.  He had chronic ischemia of his amputations for some time but ultimately presented with sepsis.  On 03/12/2022 he underwent revision to bilateral above-the-knee amputations.  He presents to the emergency department with sepsis and his AKA wounds reportedly have had some drainage and open wounds.   On exam the AKA's do not appear to be a source of sepsis.  However the family notes that they have been draining.  I think he will need a CT scan of both lower extremities but will discuss this with radiology to be sure this is the best test.  ADDENDUM: I discussed with radiology and they agree that a CT with contrast of the lower extremities would be the best test.  I have ordered this.  I have explained that if no other source for infection can be found, and he continues to have drainage, he could require revision of both AKA's in which we would shorten them both.  He understands.  [name], MD 4:00 PM   Reason for Consult:  infected bilateral AKAs Requesting Physician:  Blanchie Dessert MD MRN #:  [id number]  History of Present Illness: [name] is a 57 y.o. male with a PMH of CHF, HTN, and PAD who presented to the emergency room today with worsening appearance of bilateral above-knee amputations.  He was recently hospitalized and critically ill with sepsis and multiorgan failure including renal failure.  At this time his previous bilateral below the knee amputations were ischemic and revised to bilateral above-knee amputations on 03/12/2022 by Dr. Stanford Breed.  Days after his revision to bilateral above-knee amputations, he began to improve.   He was last seen by our office 2 weeks ago for staple removal.  He was having some phantom pain in both of his amputation sites.  He denies any fevers, chills, nausea or vomiting.  His  wife at the time stated that there was some thick yellow drainage from several areas of his incisions.  Both of his incisions were noted to have some erythema likely from staples and multiple areas of superficial wounds with yellow drainage.  Every other staple was removed in the office.  He was to keep his imitations clean and dry, and he was prescribed a 10-day course of Keflex.  Today the patient states that his amputations have been worsening over the past week.  He thinks that the superficial wounds on both of his incisions have worsened and have increased puslike drainage.  He denies any overall fevers, but feels like his amputations have felt "feverish".  He endorses some nausea but no vomiting.  He has finished his 10-day course of Keflex.  Past Medical History:  Diagnosis Date   Anemia    CHF (congestive heart failure) ([facility])    COPD (chronic obstructive pulmonary disease) ([facility])    Coronary artery disease    Depression    GERD (gastroesophageal reflux disease)    Hypertension    Neuropathy 03/12/2022   Peripheral vascular disease ([place])    Seizures ([place])    last one was "years ago"   Wheelchair dependent     Past Surgical History:  Procedure Laterality Date   AMPUTATION Bilateral 03/12/2022   Procedure: AMPUTATION ABOVE KNEE;  Surgeon: Cherre Robins, MD;  Location: [facility] OR;  Service: Vascular;  Laterality: Bilateral;   APPLICATION OF WOUND VAC Bilateral 03/12/2022   Procedure:  APPLICATION OF WOUND VAC;  Surgeon: Cherre Robins, MD;  Location: [place];  Service: Vascular;  Laterality: Bilateral;   bilateral below knee amputation Bilateral    x 2   VASCULAR SURGERY     VENA CAVA FILTER PLACEMENT N/A 12/31/2012   Procedure: INSERTION VENA-CAVA FILTER Femoral approach;  Surgeon: Angelia Mould, MD;  Location: [facility] OR;  Service: Vascular;  Laterality: N/A;    No Known Allergies  Prior to Admission medications   Medication Sig Start Date End Date Taking? Authorizing Provider   aspirin EC 81 MG tablet Take 81 mg by mouth daily. 01/14/22 01/14/23  [provider]  atorvastatin (LIPITOR) 40 MG tablet Take 40 mg by mouth at bedtime. 01/31/22   [provider]  carvedilol (COREG) 25 MG tablet Take 25 mg by mouth 2 (two) times daily with a meal. 01/31/22   [provider]  clopidogrel (PLAVIX) 75 MG tablet Take 75 mg by mouth daily.    [provider]  DULoxetine (CYMBALTA) 30 MG capsule Take 30 mg by mouth 2 (two) times daily. 01/25/22   [provider]  gabapentin (NEURONTIN) 400 MG capsule Take 400 mg by mouth 3 (three) times daily.    [provider]  HYDROcodone-acetaminophen (NORCO) 10-325 MG tablet Take 1 tablet by mouth every 6 (six) hours. 03/16/22 05/05/22  Domenic Polite, MD  HYDROcodone-acetaminophen (NORCO/VICODIN) 5-325 MG tablet Take 1 tablet by mouth 2 times daily at 12 noon and 4 pm. 03/23/22 03/23/23  Ulyses Amor, PA-C  ipratropium-albuterol (DUONEB) 0.5-2.5 (3) MG/3ML SOLN Inhale 3 mLs into the lungs every 4 (four) hours as needed (COPD). 02/09/22 03/11/22  [provider]  isosorbide dinitrate (ISORDIL) 10 MG tablet Take 10 mg by mouth 3 (three) times daily. 01/25/22   [provider]  levETIRAcetam (KEPPRA) 500 MG tablet Take 500 mg by mouth 2 (two) times daily. 02/25/22 02/25/23  [provider]  levocetirizine (XYZAL) 5 MG tablet Take 5 mg by mouth every evening.    [provider]  meclizine (ANTIVERT) 25 MG tablet Take 25 mg by mouth 3 (three) times daily as needed for dizziness.    [provider]  MOVANTIK 25 MG TABS tablet Take 25 mg by mouth daily as needed (Constipation).    [provider]  pantoprazole (PROTONIX) 40 MG tablet Take 40 mg by mouth daily. 02/25/22 02/25/23  [provider]  pyridOXINE (VITAMIN B6) 100 MG tablet Take 100 mg by mouth daily.    [provider]  tadalafil (CIALIS) 20 MG tablet Take 20 mg by mouth daily as  needed for erectile dysfunction. 11/04/21   [provider]  tamsulosin (FLOMAX) 0.4 MG CAPS capsule Take 0.4 mg by mouth daily. 08/05/21 08/05/22  [provider]  thiamine (VITAMIN B1) 100 MG tablet Take 100 mg by mouth daily. 02/25/22 02/25/23  [provider]    Social History   Socioeconomic History   Marital status: Legally Separated    Spouse name: Not on file   Number of children: Not on file   Years of education: Not on file   Highest education level: Not on file  Occupational History   Not on file  Tobacco Use   Smoking status: Every Day    Packs/day: 1.00    Years: 30.00    Additional pack years: 0.00    Total pack years: 30.00    Types: Cigarettes    Passive exposure: Current   Smokeless tobacco: Never  Vaping Use   Vaping Use: Never used  Substance and Sexual Activity   Alcohol use: Not Currently   Drug use: Yes    Types: Marijuana    Comment: 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 No pertinent family history.  ROS: Otherwise negative unless mentioned in HPI  Physical Examination  Vitals:   04/27/22 1430 04/27/22 1441  BP: (!) 77/45   Pulse: 96   Resp: 10   Temp:  97.7 F (36.5 C)  SpO2: 100%    Body mass index is 16.58 kg/m.  General: Ill-appearing, no acute distress Gait: Not observed HENT: WNL, normocephalic Pulmonary: normal non-labored breathing Cardiac: Regular, hypotensive with SBP in 70s Abdomen:  soft, NT/ND, no masses Skin: without rashes Vascular Exam/Pulses: Palpable radial pulses bilaterally Extremities: Bilateral  above-knee amputations with erythema and multiple superficial wounds with yellow drainage, L worse than right Musculoskeletal: no muscle wasting or atrophy  Neurologic: A&O X 3;  No focal weakness or paresthesias are detected; speech is fluent/normal Psychiatric:  The pt has Normal affect. Lymph:  Unremarkable       CBC    Component Value Date/Time   WBC 24.5 (H) 04/27/2022 1158   RBC 2.80 (L) 04/27/2022 1158   HGB 7.2 (L) 04/27/2022 1158   HCT 24.8 (L) 04/27/2022 1158   PLT 878 (H) 04/27/2022 1158   MCV 88.6 04/27/2022 1158   MCH 25.7 (L) 04/27/2022 1158   MCHC 29.0 (L) 04/27/2022 1158   RDW 18.0 (H) 04/27/2022 1158   LYMPHSABS 0.8 04/27/2022 1158   MONOABS 0.9 04/27/2022 1158   EOSABS 0.0 04/27/2022 1158   BASOSABS 0.1 04/27/2022 1158    BMET    Component Value Date/Time   NA 133 (L) 04/27/2022 1158   K 4.2 04/27/2022 1158   CL 107 04/27/2022 1158   CO2 16 (L) 04/27/2022 1158   GLUCOSE 108 (H) 04/27/2022 1158   BUN 31 (H) 04/27/2022 1158   CREATININE 1.03 04/27/2022 1158   CALCIUM 8.4 (L) 04/27/2022 1158   GFRNONAA >60 04/27/2022 1158   GFRAA 74 (L) 12/29/2012 1023    COAGS: Lab Results  Component Value Date   INR 1.4 (H) 04/27/2022   INR 1.2 03/12/2022     ASSESSMENT/PLAN: This is a 57 y.o. male with previous bilateral above-knee amputations on 03/12/2022   -The patient recent knee had bilateral above-knee amputations on 03/12/2022 by Dr. Stanford Breed for ischemic below-knee amputation sites.  At the time of his discharge, his incision sites were healing well -At his first office visit with our clinic he was noted to have some superficial wounds of bilateral AKA's and yellow drainage.  He was placed on a 10-day course of Keflex, which he has finished.  Both him and his family have noted worsening appearance of the wounds and an increase in "pus like" drainage over the past week.  His amputations have felt "feverish".  He endorses nausea without vomiting -He is noted  to be septic and hypotensive in the emergency room currently.  He has been started on  vancomycin and metronidazole.  He is also on Levophed and IV fluids for blood pressure support -On exam both of his above-knee amputations appear infected with multiple superficial wounds.  He may need washout versus revision of both amputation sites in the coming week.  Will likely let the patient stabilize and receive more antibiotics prior to returning to [place] admission, continuation of IV abx and BP support. Dr.Danette Weinfeld to see the patient and give further treatment plans   Vicente Serene PA-C [facility] [phone]

## 2022-04-27 NOTE — Assessment & Plan Note (Signed)
Currently smoke around 1/2 pack per day x 20-25 year. -Nicotine patch 21mg 

## 2022-04-27 NOTE — Progress Notes (Signed)
Pharmacy Antibiotic Note  [name] is a 57 y.o. 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  Pharmacy has been consulted for vancomycin and cefepime dosing. SCr 1.03    Plan: Vancomycin 1000 mg once followed by 1000 mg IV Q24H (eAUC 483) Cefepime 2 g IV Q8H Metronidazole per MD Monitor renal function and signs/symptoms of infection Follow for length of therapy  Height: 5\' 10"  (177.8 cm) Weight: 52.4 kg (115 lb 8.3 oz) IBW/kg (Calculated) : 73  Temp (24hrs), Avg:96.6 F (35.9 C), Min:95.5 F (35.3 C), Max:97.7 F (36.5 C)  Recent Labs  Lab 04/27/22 1158  WBC 24.5*    CrCl cannot be calculated (Patient's most recent lab result is older than the maximum 21 days allowed.).    No Known Allergies  Antimicrobials this admission: Metronidazole 3/19 >>  Vancomycin 3/19 >>  Cefepime 3/19 >>  Dose adjustments this admission:   Microbiology results:   Thank you for allowing pharmacy to be a part of this patient's care.  Jeneen Rinks [id number] PM

## 2022-04-27 NOTE — ED Provider Notes (Addendum)
[name] Provider Note   CSN: [id number] Arrival date & time: 04/27/22  1026     History  Chief Complaint  Patient presents with   Wound Infection    [name] is a 57 y.o. male.  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 Stanford Breed on 03/12/2022 due to sepsis as well as multi organ failure including renal failure on dialysis which improved and was d/c at the beginning for feb as well as COPD, CHF, aortic occlusion on 325 ASA who is presenting today from vascular surgery's office where he went for a follow-up appointment for a wound check.  Patient was supposed to have his staples removed today and reports the right leg is looking great but the left leg in the last few days has started to weep some and the wound does not appear to be healing.  Patient also reports in the last 2 days he is just not felt as good.  He has become very weak and tired just doing little things.  He has had a decrease in his appetite.  He has had no chest pain or new abdominal pain.  No change in his bowel movements and reports that he is still urinating without dysuria.  He has not had vomiting but was nauseated today when he tried to eat something and has had anorexia over the last 2 days.  No noted fever.  His wife denies any signs of confusion or delirium.  Patient has had no change in his medications.  When he got to the office today patient blood pressure was low he was pale and they were concerned he may have infection in his leg.  He was sent here for further care.  The history is provided by the patient and the spouse.       Home Medications Prior to Admission medications   Medication Sig Start Date End Date Taking? Authorizing Provider  aspirin EC 81 MG tablet Take 81 mg by mouth daily. 01/14/22 01/14/23  [provider]  atorvastatin (LIPITOR) 40 MG tablet Take 40 mg by mouth at bedtime.  01/31/22   [provider]  carvedilol (COREG) 25 MG tablet Take 25 mg by mouth 2 (two) times daily with a meal. 01/31/22   [provider]  clopidogrel (PLAVIX) 75 MG tablet Take 75 mg by mouth daily.    [provider]  DULoxetine (CYMBALTA) 30 MG capsule Take 30 mg by mouth 2 (two) times daily. 01/25/22   [provider]  gabapentin (NEURONTIN) 400 MG capsule Take 400 mg by mouth 3 (three) times daily.    [provider]  HYDROcodone-acetaminophen (NORCO) 10-325 MG tablet Take 1 tablet by mouth every 6 (six) hours. 03/16/22 05/05/22  Domenic Polite, MD  HYDROcodone-acetaminophen (NORCO/VICODIN) 5-325 MG tablet Take 1 tablet by mouth 2 times daily at 12 noon and 4 pm. 03/23/22 03/23/23  Ulyses Amor, PA-C  ipratropium-albuterol (DUONEB) 0.5-2.5 (3) MG/3ML SOLN Inhale 3 mLs into the lungs every 4 (four) hours as needed (COPD). 02/09/22 03/11/22  [provider]  isosorbide dinitrate (ISORDIL) 10 MG tablet Take 10 mg by mouth 3 (three) times daily. 01/25/22   [provider]  levETIRAcetam (KEPPRA) 500 MG tablet Take 500 mg by mouth 2 (two) times daily. 02/25/22 02/25/23  [provider]  levocetirizine (XYZAL) 5 MG tablet Take 5 mg by mouth every evening.  [provider]  meclizine (ANTIVERT) 25 MG tablet Take 25 mg by mouth 3 (three) times daily as needed for dizziness.    [provider]  MOVANTIK 25 MG TABS tablet Take 25 mg by mouth daily as needed (Constipation).    [provider]  pantoprazole (PROTONIX) 40 MG tablet Take 40 mg by mouth daily. 02/25/22 02/25/23  [provider]  pyridOXINE (VITAMIN B6) 100 MG tablet Take 100 mg by mouth daily.    [provider]  tadalafil (CIALIS) 20 MG tablet Take 20 mg by mouth daily as needed for erectile dysfunction. 11/04/21   [provider]  tamsulosin (FLOMAX) 0.4 MG CAPS capsule Take 0.4 mg by mouth daily. 08/05/21 08/05/22  [provider]  thiamine (VITAMIN B1) 100 MG tablet Take 100 mg by mouth daily. 02/25/22 02/25/23  [provider]      Allergies    Patient has no known allergies.    Review of Systems   Review of Systems  Physical Exam Updated Vital Signs BP (!) 81/42   Pulse 98   Temp (!) 95.5 F (35.3 C) (Rectal)   Resp 18   Ht 5\' 10"  (1.778 m)   Wt 52.4 kg   SpO2 100%   BMI 16.58 kg/m  Physical Exam Vitals and nursing note reviewed.  Constitutional:      General: He is not in acute distress.    Appearance: He is well-developed. He is ill-appearing.  HENT:     Head: Normocephalic and atraumatic.     Mouth/Throat:     Mouth: Mucous membranes are dry.  Eyes:     Conjunctiva/sclera: Conjunctivae normal.     Pupils: Pupils are equal, round, and reactive to light.  Cardiovascular:     Rate and Rhythm: Normal rate and regular rhythm.     Heart sounds: No murmur heard. Pulmonary:     Effort: Pulmonary effort is normal. No respiratory distress.     Breath sounds: Normal breath sounds. No wheezing or rales.  Abdominal:     General: There is no distension.     Palpations: Abdomen is soft.     Tenderness: There is no abdominal tenderness. There is no guarding or rebound.  Musculoskeletal:        General: No tenderness. Normal range of motion.     Cervical back: Normal range of motion and neck supple.     Comments: Bilateral AKA's.  Right appears to be healing well wound is intact.  On the left there is not significant erythema but wound appears to have some dehiscence and some fibrinous tissue present minimal yellow drainage on the bandage.  2 small decubitus wounds over the sacrum  Skin:    General: Skin is warm and dry.     Coloration: Skin is pale.     Findings: No erythema or rash.  Neurological:     Mental Status: He is alert and oriented to person, place, and time. Mental status is at baseline.  Psychiatric:        Behavior: Behavior normal.     ED Results / Procedures  / Treatments   Labs (all labs ordered are listed, but only abnormal results are displayed) Labs Reviewed  COMPREHENSIVE METABOLIC PANEL - Abnormal; Notable for the following components:      Result Value   Sodium 133 (*)    CO2 16 (*)    Glucose, Bld 108 (*)    BUN 31 (*)    Calcium 8.4 (*)  Total Protein 6.3 (*)    Albumin 1.6 (*)    AST 10 (*)    Total Bilirubin 0.2 (*)    All other components within normal limits  CBC WITH DIFFERENTIAL/PLATELET - Abnormal; Notable for the following components:   WBC 24.5 (*)    RBC 2.80 (*)    Hemoglobin 7.2 (*)    HCT 24.8 (*)    MCH 25.7 (*)    MCHC 29.0 (*)    RDW 18.0 (*)    Platelets 878 (*)    Neutro Abs 22.0 (*)    Abs Immature Granulocytes 0.69 (*)    All other components within normal limits  PROTIME-INR - Abnormal; Notable for the following components:   Prothrombin Time 16.7 (*)    INR 1.4 (*)    All other components within normal limits  CULTURE, BLOOD (ROUTINE X 2)  CULTURE, BLOOD (ROUTINE X 2)  LACTIC ACID, PLASMA  TSH  LACTIC ACID, PLASMA  URINALYSIS, ROUTINE W REFLEX MICROSCOPIC    EKG EKG Interpretation  Date/Time:  Tuesday April 27 2022 12:51:22 EDT Ventricular Rate:  105 PR Interval:  135 QRS Duration: 83 QT Interval:  312 QTC Calculation: 413 R Axis:   88 Text Interpretation: Sinus tachycardia Multiform ventricular premature complexes Borderline low voltage, extremity leads Nonspecific repol abnormality, lateral leads Baseline wander in lead(s) V2 V3 No significant change since last tracing Confirmed by Blanchie Dessert [phone]) on 04/27/2022 12:52:51 PM  Radiology No results found.  Procedures Procedures    Medications Ordered in ED Medications  lactated ringers infusion ( Intravenous New Bag/Given 04/27/22 1252)  ceFEPIme (MAXIPIME) 2 g in sodium chloride 0.9 % 100 mL IVPB (has no administration in time range)  metroNIDAZOLE (FLAGYL) IVPB 500 mg (500 mg Intravenous New Bag/Given 04/27/22 1341)   vancomycin (VANCOCIN) IVPB 1000 mg/200 mL premix (1,000 mg Intravenous New Bag/Given 04/27/22 1340)  norepinephrine (LEVOPHED) 4mg  in 251mL (0.016 mg/mL) premix infusion (has no administration in time range)  lactated ringers bolus 1,000 mL (0 mLs Intravenous Stopped 04/27/22 1421)    ED Course/ Medical Decision Making/ A&P                             Medical Decision Making Amount and/or Complexity of Data Reviewed Independent Historian: spouse and EMS External Data Reviewed: notes.    Details: Recent hospitalization Labs: ordered. Decision-making details documented in ED Course. Radiology: ordered and independent interpretation performed. Decision-making details documented in ED Course. ECG/medicine tests: ordered and independent interpretation performed. Decision-making details documented in ED Course.  Risk Prescription drug management. Decision regarding hospitalization.   Pt with multiple medical problems and comorbidities and presenting today with a complaint that caries a high risk for morbidity and mortality.  Here today with concern for sepsis versus anemia versus AKI versus electrolyte abnormalities.  Patient did meet 2 sepsis criteria with hypothermia and hypotension.  He does have a history of hypertension but has not taken lisinopril today.  He has also had poor oral intake in the last few days.  Undifferentiated sepsis was initiated.  Patient was placed on a [employer]. 2:35 PM I independently interpreted patient's EKG and labs.  CBC with a leukocytosis of 24,000 and hemoglobin today of 7.2 which is 1 g lower than his baseline at 8 with elevated platelet count of 878 which is unchanged from prior, INR slightly elevated at 1.4.  Lactic acid within normal limit send CMP within normal limits.  EKG without acute findings today.  Given patient's elevated white count, hypotension and hypothermia he was covered broadly with antibiotics due to concern for sepsis with shock.  Patient  had difficulty with access and delay getting fluids, however patient is awake alert and mentating well in the bed.\ 2:35 PM Patient's blood pressure remains low despite fluid bolus and normal lactate.  Patient is still getting IV fluids but has not had significant change in blood pressure checked in both arms and Levophed was started.  Will discuss with vascular surgery.  Will talk to ICU.  Findings were discussed with the patient and his family members.  He is comfortable with this plan.  No airway compromise at this time.  On reevaluation now left AKA has some erythema and warmth.  2:41 PM Spoke with Dr. Scot Dock with vascular surgery and they will consult on the pt.  CRITICAL CARE Performed by: Jaleeyah Munce Total critical care time: 40 minutes Critical care time was exclusive of separately billable procedures and treating other patients. Critical care was necessary to treat or prevent imminent or life-threatening deterioration. Critical care was time spent personally by me on the following activities: development of treatment plan with patient and/or surrogate as well as nursing, discussions with consultants, evaluation of patient's response to treatment, examination of patient, obtaining history from patient or surrogate, ordering and performing treatments and interventions, ordering and review of laboratory studies, ordering and review of radiographic studies, pulse oximetry and re-evaluation of patient's condition.         Final Clinical Impression(s) / ED Diagnoses Final diagnoses:  Wound infection  Sepsis with acute organ dysfunction and septic shock, due to unspecified organism, unspecified organ dysfunction type [facility])    Rx / DC Orders ED Discharge Orders     None         Blanchie Dessert, MD 04/27/22 1435    Blanchie Dessert, MD 04/27/22 1441

## 2022-04-27 NOTE — Progress Notes (Signed)
POST OPERATIVE OFFICE NOTE    CC:  F/u for surgery  HPI:   This is a 57 y.o. male who developed ischemic changes to B BKA with sepsis and multi organ failure.   He required dialysis.   He is s/p: Bilateral above-the-knee amputations 03/12/22 by Dr. Stanford Breed.   Today he states he feels weak and very tired.  He has a history of anemia needing transfusion, and he continues to smoke.     He is here today for staple removal B AKA.  He has not had fever, chills or purelent drainage.        No Known Allergies  Current Outpatient Medications  Medication Sig Dispense Refill   aspirin EC 81 MG tablet Take 81 mg by mouth daily.     atorvastatin (LIPITOR) 40 MG tablet Take 40 mg by mouth at bedtime.     carvedilol (COREG) 25 MG tablet Take 25 mg by mouth 2 (two) times daily with a meal.     clopidogrel (PLAVIX) 75 MG tablet Take 75 mg by mouth daily.     DULoxetine (CYMBALTA) 30 MG capsule Take 30 mg by mouth 2 (two) times daily.     gabapentin (NEURONTIN) 400 MG capsule Take 400 mg by mouth 3 (three) times daily.     HYDROcodone-acetaminophen (NORCO) 10-325 MG tablet Take 1 tablet by mouth every 6 (six) hours. 30 tablet 0   HYDROcodone-acetaminophen (NORCO/VICODIN) 5-325 MG tablet Take 1 tablet by mouth 2 times daily at 12 noon and 4 pm. 20 tablet 0   ipratropium-albuterol (DUONEB) 0.5-2.5 (3) MG/3ML SOLN Inhale 3 mLs into the lungs every 4 (four) hours as needed (COPD).     isosorbide dinitrate (ISORDIL) 10 MG tablet Take 10 mg by mouth 3 (three) times daily.     levETIRAcetam (KEPPRA) 500 MG tablet Take 500 mg by mouth 2 (two) times daily.     levocetirizine (XYZAL) 5 MG tablet Take 5 mg by mouth every evening.     meclizine (ANTIVERT) 25 MG tablet Take 25 mg by mouth 3 (three) times daily as needed for dizziness.     MOVANTIK 25 MG TABS tablet Take 25 mg by mouth daily as needed (Constipation).     pantoprazole (PROTONIX) 40 MG tablet Take 40 mg by mouth daily.     pyridOXINE (VITAMIN B6)  100 MG tablet Take 100 mg by mouth daily.     tadalafil (CIALIS) 20 MG tablet Take 20 mg by mouth daily as needed for erectile dysfunction.     tamsulosin (FLOMAX) 0.4 MG CAPS capsule Take 0.4 mg by mouth daily.     thiamine (VITAMIN B1) 100 MG tablet Take 100 mg by mouth daily.     No current facility-administered medications for this visit.     ROS:  See HPI  Physical Exam:    Incision:  right AKA healing well without signs of infection Left AKA yellow eschar, superficial dehiscence if incision Staples were removed patient tolerated this well Wet to dry dressing applied to left AKA incision.  Vitals:   04/27/22 0933  BP: 132/80  Pulse: 86  Temp: 97.7 F (36.5 C)     Assessment/Plan:  This is a 57 y.o. male who developed ischemic changes to B BKA with sepsis and multi organ failure.   He required dialysis.   He is s/p: Bilateral above-the-knee amputations 03/12/22 by Dr. Stanford Breed.  F/U in 2-3 weeks for left AKA stump incision check.    He is ill appearing.  He appears possibly jaundice or anemic.  He states he is still producing urine.  He will go to [facility] after this visit for further workup of illness.  He will continue ASA, Plavix and Staitn daily.     Roxy Horseman PA-C [facility] [phone]   Clinic MD:  Stanford Breed

## 2022-04-27 NOTE — ED Notes (Signed)
Pt placed on bair hugger

## 2022-04-28 DIAGNOSIS — T8744 Infection of amputation stump, left lower extremity: Secondary | ICD-10-CM | POA: Diagnosis present

## 2022-04-28 DIAGNOSIS — L03116 Cellulitis of left lower limb: Secondary | ICD-10-CM | POA: Diagnosis present

## 2022-04-28 DIAGNOSIS — G40909 Epilepsy, unspecified, not intractable, without status epilepticus: Secondary | ICD-10-CM | POA: Diagnosis present

## 2022-04-28 DIAGNOSIS — G8929 Other chronic pain: Secondary | ICD-10-CM | POA: Diagnosis present

## 2022-04-28 DIAGNOSIS — D649 Anemia, unspecified: Secondary | ICD-10-CM | POA: Diagnosis present

## 2022-04-28 DIAGNOSIS — E861 Hypovolemia: Secondary | ICD-10-CM | POA: Diagnosis present

## 2022-04-28 DIAGNOSIS — J449 Chronic obstructive pulmonary disease, unspecified: Secondary | ICD-10-CM | POA: Diagnosis present

## 2022-04-28 DIAGNOSIS — L03115 Cellulitis of right lower limb: Secondary | ICD-10-CM | POA: Diagnosis present

## 2022-04-28 DIAGNOSIS — R6521 Severe sepsis with septic shock: Secondary | ICD-10-CM | POA: Diagnosis present

## 2022-04-28 DIAGNOSIS — L089 Local infection of the skin and subcutaneous tissue, unspecified: Secondary | ICD-10-CM | POA: Diagnosis not present

## 2022-04-28 DIAGNOSIS — F17210 Nicotine dependence, cigarettes, uncomplicated: Secondary | ICD-10-CM | POA: Diagnosis present

## 2022-04-28 DIAGNOSIS — T148XXA Other injury of unspecified body region, initial encounter: Secondary | ICD-10-CM | POA: Diagnosis not present

## 2022-04-28 DIAGNOSIS — I739 Peripheral vascular disease, unspecified: Secondary | ICD-10-CM | POA: Diagnosis present

## 2022-04-28 DIAGNOSIS — F32A Depression, unspecified: Secondary | ICD-10-CM | POA: Diagnosis present

## 2022-04-28 DIAGNOSIS — Z993 Dependence on wheelchair: Secondary | ICD-10-CM | POA: Diagnosis not present

## 2022-04-28 DIAGNOSIS — Z96642 Presence of left artificial hip joint: Secondary | ICD-10-CM | POA: Diagnosis present

## 2022-04-28 DIAGNOSIS — F1290 Cannabis use, unspecified, uncomplicated: Secondary | ICD-10-CM | POA: Diagnosis present

## 2022-04-28 DIAGNOSIS — E785 Hyperlipidemia, unspecified: Secondary | ICD-10-CM | POA: Diagnosis present

## 2022-04-28 DIAGNOSIS — T8743 Infection of amputation stump, right lower extremity: Secondary | ICD-10-CM | POA: Diagnosis present

## 2022-04-28 DIAGNOSIS — I5032 Chronic diastolic (congestive) heart failure: Secondary | ICD-10-CM | POA: Diagnosis present

## 2022-04-28 DIAGNOSIS — I2510 Atherosclerotic heart disease of native coronary artery without angina pectoris: Secondary | ICD-10-CM | POA: Diagnosis present

## 2022-04-28 DIAGNOSIS — Z79899 Other long term (current) drug therapy: Secondary | ICD-10-CM | POA: Diagnosis not present

## 2022-04-28 DIAGNOSIS — Y835 Amputation of limb(s) as the cause of abnormal reaction of the patient, or of later complication, without mention of misadventure at the time of the procedure: Secondary | ICD-10-CM | POA: Diagnosis present

## 2022-04-28 DIAGNOSIS — A419 Sepsis, unspecified organism: Secondary | ICD-10-CM | POA: Diagnosis present

## 2022-04-28 DIAGNOSIS — Z89612 Acquired absence of left leg above knee: Secondary | ICD-10-CM | POA: Diagnosis not present

## 2022-04-28 DIAGNOSIS — Z89611 Acquired absence of right leg above knee: Secondary | ICD-10-CM | POA: Diagnosis not present

## 2022-04-28 DIAGNOSIS — I110 Hypertensive heart disease with heart failure: Secondary | ICD-10-CM | POA: Diagnosis present

## 2022-04-28 LAB — CBC WITH DIFFERENTIAL/PLATELET
Abs Immature Granulocytes: 0.15 10*3/uL — ABNORMAL HIGH (ref 0.00–0.07)
Basophils Absolute: 0.1 10*3/uL (ref 0.0–0.1)
Basophils Relative: 1 %
Eosinophils Absolute: 0.1 10*3/uL (ref 0.0–0.5)
Eosinophils Relative: 0 %
HCT: 26.2 % — ABNORMAL LOW (ref 39.0–52.0)
Hemoglobin: 8 g/dL — ABNORMAL LOW (ref 13.0–17.0)
Immature Granulocytes: 1 %
Lymphocytes Relative: 6 %
Lymphs Abs: 0.7 10*3/uL (ref 0.7–4.0)
MCH: 26.9 pg (ref 26.0–34.0)
MCHC: 30.5 g/dL (ref 30.0–36.0)
MCV: 88.2 fL (ref 80.0–100.0)
Monocytes Absolute: 1.1 10*3/uL — ABNORMAL HIGH (ref 0.1–1.0)
Monocytes Relative: 9 %
Neutro Abs: 11.1 10*3/uL — ABNORMAL HIGH (ref 1.7–7.7)
Neutrophils Relative %: 83 %
Platelets: 789 10*3/uL — ABNORMAL HIGH (ref 150–400)
RBC: 2.97 MIL/uL — ABNORMAL LOW (ref 4.22–5.81)
RDW: 17.1 % — ABNORMAL HIGH (ref 11.5–15.5)
WBC: 13 10*3/uL — ABNORMAL HIGH (ref 4.0–10.5)
nRBC: 0 % (ref 0.0–0.2)

## 2022-04-28 LAB — RENAL FUNCTION PANEL
Albumin: 2 g/dL — ABNORMAL LOW (ref 3.5–5.0)
Anion gap: 11 (ref 5–15)
BUN: 22 mg/dL — ABNORMAL HIGH (ref 6–20)
CO2: 16 mmol/L — ABNORMAL LOW (ref 22–32)
Calcium: 8.3 mg/dL — ABNORMAL LOW (ref 8.9–10.3)
Chloride: 109 mmol/L (ref 98–111)
Creatinine, Ser: 0.97 mg/dL (ref 0.61–1.24)
GFR, Estimated: >60 mL/min (ref >60)
Glucose, Bld: 104 mg/dL — ABNORMAL HIGH (ref 70–99)
Phosphorus: 3.3 mg/dL (ref 2.5–4.6)
Potassium: 3.7 mmol/L (ref 3.5–5.1)
Sodium: 136 mmol/L (ref 135–145)

## 2022-04-28 LAB — IRON AND TIBC
Iron: 17 ug/dL — ABNORMAL LOW (ref 45–182)
Saturation Ratios: 12 % — ABNORMAL LOW (ref 17.9–39.5)
TIBC: 147 ug/dL — ABNORMAL LOW (ref 250–450)
UIBC: 130 ug/dL

## 2022-04-28 LAB — FERRITIN: Ferritin: 481 ng/mL — ABNORMAL HIGH (ref 24–336)

## 2022-04-28 MED ORDER — ACETAMINOPHEN 325 MG PO TABS
650.0000 mg | ORAL_TABLET | Freq: Four times a day (QID) | ORAL | Status: DC
  Administered 2022-04-29 (×2): 650 mg via ORAL
  Filled 2022-04-28 (×2): qty 2

## 2022-04-28 MED ORDER — TAMSULOSIN HCL 0.4 MG PO CAPS
0.4000 mg | ORAL_CAPSULE | Freq: Every day | ORAL | Status: DC
  Administered 2022-04-28 – 2022-04-29 (×2): 0.4 mg via ORAL
  Filled 2022-04-28 (×2): qty 1

## 2022-04-28 MED ORDER — OXYCODONE HCL 5 MG PO TABS: 5.0000 mg | ORAL_TABLET | Freq: Four times a day (QID) | ORAL | Status: DC

## 2022-04-28 MED ORDER — HYDROMORPHONE HCL 1 MG/ML IJ SOLN
0.5000 mg | Freq: Four times a day (QID) | INTRAMUSCULAR | Status: DC | PRN
  Administered 2022-04-28 – 2022-04-29 (×4): 0.5 mg via INTRAVENOUS
  Filled 2022-04-28 (×4): qty 1

## 2022-04-28 MED ORDER — ACETAMINOPHEN 325 MG PO TABS
650.0000 mg | ORAL_TABLET | Freq: Four times a day (QID) | ORAL | Status: DC | PRN
  Administered 2022-04-28: 650 mg via ORAL
  Filled 2022-04-28: qty 2

## 2022-04-28 MED ORDER — VANCOMYCIN HCL 750 MG/150ML IV SOLN
750.0000 mg | INTRAVENOUS | Status: DC
  Administered 2022-04-28: 750 mg via INTRAVENOUS
  Filled 2022-04-28 (×3): qty 150

## 2022-04-28 MED ORDER — HYDROCODONE-ACETAMINOPHEN 10-325 MG PO TABS
1.0000 | ORAL_TABLET | Freq: Four times a day (QID) | ORAL | Status: DC
  Administered 2022-04-28 (×3): 1 via ORAL
  Filled 2022-04-28 (×3): qty 1

## 2022-04-28 MED ORDER — ACETAMINOPHEN 325 MG PO TABS: 650.0000 mg | ORAL_TABLET | Freq: Four times a day (QID) | ORAL | Status: DC

## 2022-04-28 MED ORDER — OXYCODONE HCL 5 MG PO TABS
5.0000 mg | ORAL_TABLET | Freq: Four times a day (QID) | ORAL | Status: DC
  Administered 2022-04-29 (×2): 5 mg via ORAL
  Filled 2022-04-28 (×2): qty 1

## 2022-04-28 MED ORDER — DULOXETINE HCL 30 MG PO CPEP
30.0000 mg | ORAL_CAPSULE | Freq: Two times a day (BID) | ORAL | Status: DC
  Administered 2022-04-28 – 2022-04-29 (×3): 30 mg via ORAL
  Filled 2022-04-28 (×3): qty 1

## 2022-04-28 MED ORDER — ENOXAPARIN SODIUM 30 MG/0.3ML IJ SOSY
30.0000 mg | PREFILLED_SYRINGE | INTRAMUSCULAR | Status: DC
  Administered 2022-04-28 – 2022-04-29 (×2): 30 mg via SUBCUTANEOUS
  Filled 2022-04-28 (×2): qty 0.3

## 2022-04-28 NOTE — Progress Notes (Signed)
OT Cancellation Note  Patient Details Name: MALEKI AAGARD MRN: [id number] DOB: 1965-08-23   Cancelled Treatment:    Reason Eval/Treat Not Completed: Patient declined, no reason specified (Pt agitated and requesting pain medication from RN staff. Pt clear in stating that he will not participate with OT/PT today. Will check back as time and schedule allows.)  Almyra Deforest, OTR/L 04/28/2022, 9:26 AM

## 2022-04-28 NOTE — Progress Notes (Signed)
PT Cancellation Note  Patient Details Name: [name] MRN: [id number] DOB: 01/21/1966   Cancelled Treatment:    Reason Eval/Treat Not Completed: Patient declined, no reason specified (Pt agitated and requests that we return later. Will try again if time allows)   Viann Shove 04/28/2022, 9:22 AM

## 2022-04-28 NOTE — Progress Notes (Signed)
   VASCULAR SURGERY ASSESSMENT & PLAN:   S/P BILATERAL AKA'S: His AKA's look about the same.  Currently there is no significant drainage.  There is no significant erythema.  His CT scan did not show any evidence of abscess or extensive soft tissue infection.  Of note, the family felt strongly about not using contrast because he had some transient renal failure previously when he had sepsis.  His renal function however is normal.   The other concern is ischemia as he does have some pain at the distal aspect of his AKA's although this may be related to his incisions.  It does not sound like rest pain.  However, he has a known aortic occlusion with no options for revascularization.  He has undergone previous thoracal femoral bypass which failed and was not felt to be a good candidate for axillofemoral bypass given his very low EF and problems with low blood pressure.  Thus the only remaining option if he develops rest pain or worsening wounds on his AKA's would be to shorten both AKA's.  I do not think we need to rush into that and certainly he does not want to consider that option at this point.  SUBJECTIVE:   Resting comfortably.  PHYSICAL EXAM:   Vitals:   04/28/22 0100 04/28/22 0200 04/28/22 0248 04/28/22 0500  BP: 108/63 (!) 102/58 102/71 129/83  Pulse: 90 95  95  Resp: 16 16 15 19   Temp:   98.7 F (37.1 C) 98.7 F (37.1 C)  TempSrc:   Oral Oral  SpO2: 94% 94% 94% 99%  Weight:      Height:       His AKA's look about the same.  There is no significant drainage.  There is mild erythema.  LABS:   Lab Results  Component Value Date   WBC 13.0 (H) 04/28/2022   HGB 8.0 (L) 04/28/2022   HCT 26.2 (L) 04/28/2022   MCV 88.2 04/28/2022   PLT 789 (H) 04/28/2022   Lab Results  Component Value Date   CREATININE 0.97 04/28/2022   Lab Results  Component Value Date   INR 1.4 (H) 04/27/2022   PROBLEM LIST:    Principal Problem:   Sepsis ([place]) Active Problems:   CHF (congestive  heart failure) (HCC)   Wound infection   Anemia   Tobacco use disorder  CURRENT MEDS:    sodium chloride   Intravenous Once   aspirin EC  81 mg Oral Daily   atorvastatin  40 mg Oral QHS   clopidogrel  75 mg Oral Daily   enoxaparin (LOVENOX) injection  40 mg Subcutaneous Q24H   gabapentin  400 mg Oral TID   levETIRAcetam  500 mg Oral BID   loratadine  10 mg Oral QPM   nicotine  21 mg Transdermal Daily   pantoprazole  40 mg Oral Daily    Deitra Mayo Office: [phone] 04/28/2022

## 2022-04-28 NOTE — Hospital Course (Signed)
[name] is a 57 y.o.male with a history of bilateral AKA, COPD, HTN, seizures, anemia, CHF, CAD, depression and PVD who was admitted to the [facility] Medicine Teaching Service at [facility] for sepsis. His hospital course is detailed below:  Sepsis Upon presentation to ED patient hypotensive, tachycardic with leukocytosis to 24.5 meeting SIRS criteria with likely source as erythematous and draining bilateral AKA sites. Started on borad spectrum antibiotics with Cefepime/Vanc/Flagyl and given fluid resuscitation. Continued to be hypotensive, CCM consulted and started Norepi and gave additional fluid support. BP stabilized and patient transferred to [facility]MT[facility]. VVS consulted and recommended CT of AKA sites. CT did not show signs of abscess or osteomyelitis. VVS opted for conservative outpatient management. Transitions to PO Augmentin and Doxycyline x 14 day course.  Anemia Hgb 7.2 upon admission (threshold <8 given CAD), given 1u pRBCs. Post H&H >8 but Hgb 7.4 the following day. Received additional 1u pRBCs. No signs of bleeding and asymptomatic. Baseline Hgb 8-9, likely near baseline. Hgb stable at discharge.   Other chronic conditions were medically managed with home medications and formulary alternatives as necessary (seizure disorder, GERD, chronic pain, depression)  PCP Follow-up Recommendations:  Repeat CBC to check anemia BP monitoring, add back Coreg and Imdur as appropriate Smoking cessation resources Close VVS follow up given chronic, non-healing AKA wounds

## 2022-04-28 NOTE — Progress Notes (Signed)
Pharmacy Antibiotic Note  SHAFI MORGANTE is a 57 y.o. 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  Pharmacy has been consulted for vancomycin and cefepime dosing. SCr 0.97.  Noted updated weight 43.1kg.  Initial dosing was based on previous weight of 52 kg.  Will adjust dose accordingly.  Plan: Decrease Vancomycin 750 mg IV Q24H (eAUC 498) Continue Cefepime 2 g IV Q8H Monitor renal function and signs/symptoms of infection Follow for length of therapy  Height: 5\' 10"  (177.8 cm) Weight: 43.1 kg (95 lb 0.3 oz) IBW/kg (Calculated) : 73  Temp (24hrs), Avg:98.3 F (36.8 C), Min:97.7 F (36.5 C), Max:98.8 F (37.1 C)  Recent Labs  Lab 04/27/22 1158 04/28/22 0155  WBC 24.5* 13.0*  CREATININE 1.03 0.97  LATICACIDVEN 0.9  --      Estimated Creatinine Clearance: 53.1 mL/min (by C-G formula based on SCr of 0.97 mg/dL).    No Known Allergies  Antimicrobials this admission: Metronidazole 3/19 x 1 Vancomycin 3/19 >>  Cefepime 3/19 >>  Dose adjustments this admission:   Microbiology results:   Thank you for allowing pharmacy to be a part of this patient's care.  Elmer Ramp 04/28/2022 12:19 PM

## 2022-04-28 NOTE — TOC Initial Note (Signed)
Transition of Care [facility]) - Initial/Assessment Note    Patient Details  Name: [name] MRN: [id number] Date of Birth: [birth year]  Transition of Care [facility]) CM/SW Contact:    Bethena Roys, RN Phone Number: 04/28/2022, 3:55 PM  Clinical Narrative:  Patient presented for sepsis 2/2 bilateral AKA site infections. Patient initiated on IV antibiotics. PTA patient was form home with support of spouse. Patient was previously active with [facility] and they signed off- patient was able to transfer. Patient has DME bedside commode, shower chair, and he is getting a customed reclining wheelchair made by the end of March. Case Manager will continue to follow for transition of care needs as the patient progresses.                  Expected Discharge Plan: Home/Self Care Barriers to Discharge: Continued Medical Work up   Patient Goals and CMS Choice Patient states their goals for this hospitalization and ongoing recovery are:: plan will be to return home.   Expected Discharge Plan and Services In-house Referral: NA Discharge Planning Services: CM Consult Post Acute Care Choice: NA (previously active with [facility].) Living arrangements for the past 2 months: Single Family Home                 DME Arranged: N/A     HH Arranged: NA  Prior Living Arrangements/Services Living arrangements for the past 2 months: Single Family Home Lives with:: Spouse Patient language and need for interpreter reviewed:: Yes Do you feel safe going back to the place where you live?: Yes      Need for Family Participation in Patient Care: Yes (Comment) Care giver support system in place?: Yes (comment) Current home services: DME (tub bench, bedside commode.) Criminal Activity/Legal Involvement Pertinent to Current Situation/Hospitalization: No - Comment as needed  Permission Sought/Granted Permission sought to share information with : Family Supports, Case Manager Permission  granted to share information with : Yes, Verbal Permission Granted     Emotional Assessment Appearance:: Appears stated age       Alcohol / Substance Use: Not Applicable Psych Involvement: No (comment)  Admission diagnosis:  Wound infection [T14.8XXA, L08.9] Sepsis ([place]) [A41.9] Sepsis with acute organ dysfunction and septic shock, due to unspecified organism, unspecified organ dysfunction type ([place]) [A41.9, R65.21] Patient Active Problem List   Diagnosis Date Noted   Wound infection 04/27/2022   Sepsis ([place]) 04/27/2022   Anemia 04/27/2022   Tobacco use disorder 04/27/2022   Neuropathy 03/12/2022   Ischemia of right BKA site ([place]) 03/12/2022   Ischemia of left BKA site ([place]) 03/12/2022   Status post bilateral above knee amputation ([place]) 03/12/2022   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 [place] [address] Phone: [phone] Fax: [phone]  Social Determinants of Health (SDOH) Social History: SDOH Screenings   Tobacco Use: High Risk (04/27/2022)   Readmission Risk Interventions     No data to display

## 2022-04-28 NOTE — Progress Notes (Addendum)
Daily Progress Note Intern Pager: [phone]  Patient name: [name] Medical record number: [id number] Date of birth: 16-Apr-1965 Age: 57 y.o. Gender: male  Primary Care Provider: Lorelee Market, MD Consultants: VVS Code Status: Full code  Pt Overview and Major Events to Date:  3/19: Admitted to FMTS  Assessment and Plan: SON BONESTEEL is a 57 y.o. male presenting with sepsis most likely secondary to bilateral AKA site infections. PMHx includes bilateral AKA, COPD, HTN, seizures, anemia, CHF, CAD, depression and PVD.  * Sepsis ([place]) BP stable overnight and afebrile. WBC improved from 24.5 to 13. CT consistent with cellulitis and did not show signs of abscess or osteomyelitis. Per VVS non-operative management and continued supportive therapy. If patient continues to have resting pain may require AKA revision but not imminently. BC no growth @ 12 hours. -VVS consulted, appreciate recs -On Cefepime and Vancomycin -Stop MIVF with advanced diet -Pain control: Home Norco 10-325 q6h, Dilaudid 0.5mg  q6h prn -ASA, Plavix and Crestor -F/u blood culture -Consider small bolus if hypotensive  Anemia Post H&H 8.0. No reported bleeding or blood in stools. Baseline ~8-9. Iron studies consistent with IDA, would benefit from oral supplementation OP. -CBC in AM -Consider oral iron on discharge -Transfusion threshold <8  Tobacco use disorder Currently smoke around 1/2 pack per day x 20-25 year. -Nicotine patch 21mg   CHF (congestive heart failure) ([place]) Appears euvolemic upon exam. Most recent Echo in 2021 with EF 55-60%. -Holding home Isordil 10mg  TID and Coreg 25mg  BID in the setting of soft BPs   Chronic stable conditions: Seizures: Keppra 500mg  BID  GERD: Protonix 43mf daily Depression: Duloxetine 30mg  BID HLD: Lipitor 40mg  daily Neuropathy: Gabapentin 400mg  TID BPH: Flomax 0.4mg  daily  FEN/GI: Heart healthy diet PPx: Lovenox Dispo: Home pending medical  stabilization and VVS evaluation  Subjective:  Patient assessed at bedside, sleeping hard with soft snoring. Woke to state no concerns or questions.  Objective: Temp:  [97.7 F (36.5 C)-98.8 F (37.1 C)] 98.8 F (37.1 C) (03/20 1003) Pulse Rate:  [90-112] 105 (03/20 1003) Resp:  [10-23] 16 (03/20 1003) BP: (74-129)/(42-84) 104/67 (03/20 1003) SpO2:  [92 %-100 %] 94 % (03/20 1003) Weight:  [43.1 kg] 43.1 kg (03/19 1939) Physical Exam: General: Laying on side resting, NAD Cardiovascular: RRR without murmur Respiratory: Mild coarse breath sounds. Normal WOB on RA Abdomen: Soft, non-tender, non-distended Extremities: Bilateral AKA site with scabbing and some purulent drainage.  Laboratory: Most recent CBC Lab Results  Component Value Date   WBC 13.0 (H) 04/28/2022   HGB 8.0 (L) 04/28/2022   HCT 26.2 (L) 04/28/2022   MCV 88.2 04/28/2022   PLT 789 (H) 04/28/2022   Most recent BMP    Latest Ref Rng & Units 04/28/2022    1:55 AM  BMP  Glucose 70 - 99 mg/dL 104   BUN 6 - 20 mg/dL 22   Creatinine 0.61 - 1.24 mg/dL 0.97   Sodium 135 - 145 mmol/L 136   Potassium 3.5 - 5.1 mmol/L 3.7   Chloride 98 - 111 mmol/L 109   CO2 22 - 32 mmol/L 16   Calcium 8.9 - 10.3 mg/dL 8.3     Other pertinent labs: Iron: 17 TIBC: 147 Ferritin: 481  Imaging/Diagnostic Tests: CT EXTREM LOWER WO CM BIL Result Date: 04/27/2022 IMPRESSION: Prior bilateral above knee amputations. Diffuse edema throughout the subcutaneous soft tissues without focal fluid collection/abscess or evidence of osteomyelitis.   DG Chest Port 1 View Result Date: 04/27/2022 IMPRESSION:  Nonspecific interstitial prominence consistent with atypical infection, edema or interstitial lung disease. No focal consolidation.   Colletta Maryland, MD 04/28/2022, 12:01 PM  PGY-1, [place] Intern pager: [phone], text pages welcome Secure chat group [place]

## 2022-04-28 NOTE — Progress Notes (Signed)
FMTS Brief Progress Note  S:Went bedside w/ Dr. Sabra Heck to see patient. Patient laying in bed, complaining of pain, said he was waiting for his RN to bring his next dose of pain medication, and had been waiting a while. He asked if we could adjust the frequency to every 4 hours, as pain medicine wasn't lasting 6 hours, as it was due. Patient otherwise doing well.    O: BP 114/75 (BP Location: Right Arm)   Pulse 97   Temp 98.5 F (36.9 C) (Oral)   Resp 16   Ht 5\' 10"  (1.778 m)   Wt 43.1 kg   SpO2 97%   BMI 13.63 kg/m   General: NAD, but in discomfort  A/P: Sepsis Patient brought in for sepsis likely extending form bilateral leg wounds. Patient continuing to improve w/ improved WBC and Vitals. VSS this evening.  -plans per day team  Pain Patient experiencing pain from leg wounds and sacral wound. Patient notes pain regimen to infrequent, and request for change. Will adjust pain regimen. D/c Norco and increase dose of tylenol to 650. Will also start Oxycodone 5 mg. Will schedule both meds q6h. Patient still has prn dilaudid for breakthrough pain.  -Tylenol 650 q6h -Oxycodone 5 mg q6h -D/c Norco -Dilaudid 0.5 mg q6h prn  - Orders reviewed. Labs for AM ordered, which was adjusted as needed.   Holley Bouche, MD 04/28/2022, 9:57 PM PGY-2, [employer] Night Resident  Please page [phone] with questions.

## 2022-04-29 ENCOUNTER — Other Ambulatory Visit (HOSPITAL_COMMUNITY): Payer: Self-pay

## 2022-04-29 DIAGNOSIS — A419 Sepsis, unspecified organism: Secondary | ICD-10-CM | POA: Diagnosis not present

## 2022-04-29 LAB — BASIC METABOLIC PANEL
Anion gap: 6 (ref 5–15)
BUN: 22 mg/dL — ABNORMAL HIGH (ref 6–20)
CO2: 18 mmol/L — ABNORMAL LOW (ref 22–32)
Calcium: 7.9 mg/dL — ABNORMAL LOW (ref 8.9–10.3)
Chloride: 109 mmol/L (ref 98–111)
Creatinine, Ser: 0.88 mg/dL (ref 0.61–1.24)
GFR, Estimated: >60 mL/min (ref >60)
Glucose, Bld: 95 mg/dL (ref 70–99)
Potassium: 3.7 mmol/L (ref 3.5–5.1)
Sodium: 133 mmol/L — ABNORMAL LOW (ref 135–145)

## 2022-04-29 LAB — CBC WITH DIFFERENTIAL/PLATELET
Abs Immature Granulocytes: 0.13 10*3/uL — ABNORMAL HIGH (ref 0.00–0.07)
Basophils Absolute: 0.1 10*3/uL (ref 0.0–0.1)
Basophils Relative: 1 %
Eosinophils Absolute: 0.1 10*3/uL (ref 0.0–0.5)
Eosinophils Relative: 1 %
HCT: 24.2 % — ABNORMAL LOW (ref 39.0–52.0)
Hemoglobin: 7.4 g/dL — ABNORMAL LOW (ref 13.0–17.0)
Immature Granulocytes: 1 %
Lymphocytes Relative: 13 %
Lymphs Abs: 1.2 10*3/uL (ref 0.7–4.0)
MCH: 26.8 pg (ref 26.0–34.0)
MCHC: 30.6 g/dL (ref 30.0–36.0)
MCV: 87.7 fL (ref 80.0–100.0)
Monocytes Absolute: 1.3 10*3/uL — ABNORMAL HIGH (ref 0.1–1.0)
Monocytes Relative: 14 %
Neutro Abs: 6.3 10*3/uL (ref 1.7–7.7)
Neutrophils Relative %: 70 %
Platelets: 592 10*3/uL — ABNORMAL HIGH (ref 150–400)
RBC: 2.76 MIL/uL — ABNORMAL LOW (ref 4.22–5.81)
RDW: 17.4 % — ABNORMAL HIGH (ref 11.5–15.5)
WBC: 9.1 10*3/uL (ref 4.0–10.5)
nRBC: 0 % (ref 0.0–0.2)

## 2022-04-29 LAB — HEMOGLOBIN AND HEMATOCRIT, BLOOD
HCT: 31.5 % — ABNORMAL LOW (ref 39.0–52.0)
Hemoglobin: 10.3 g/dL — ABNORMAL LOW (ref 13.0–17.0)

## 2022-04-29 LAB — PREPARE RBC (CROSSMATCH)

## 2022-04-29 MED ORDER — AMOXICILLIN-POT CLAVULANATE 875-125 MG PO TABS: 1.0000 | ORAL_TABLET | Freq: Two times a day (BID) | ORAL | Status: DC

## 2022-04-29 MED ORDER — DOXYCYCLINE HYCLATE 100 MG PO TABS
100.0000 mg | ORAL_TABLET | Freq: Two times a day (BID) | ORAL | Status: DC
  Administered 2022-04-29: 100 mg via ORAL
  Filled 2022-04-29: qty 1

## 2022-04-29 MED ORDER — DOXYCYCLINE HYCLATE 100 MG PO TABS
100.0000 mg | ORAL_TABLET | Freq: Two times a day (BID) | ORAL | 0 refills | Status: DC
  Filled 2022-04-29: qty 24, 12d supply, fill #0

## 2022-04-29 MED ORDER — CARVEDILOL 12.5 MG PO TABS
12.5000 mg | ORAL_TABLET | Freq: Two times a day (BID) | ORAL | 0 refills | Status: DC
  Filled 2022-04-29: qty 60, 30d supply, fill #0

## 2022-04-29 MED ORDER — CARVEDILOL 12.5 MG PO TABS: 12.5000 mg | ORAL_TABLET | Freq: Two times a day (BID) | ORAL | Status: DC

## 2022-04-29 MED ORDER — AMOXICILLIN-POT CLAVULANATE 500-125 MG PO TABS
1.0000 | ORAL_TABLET | Freq: Two times a day (BID) | ORAL | 0 refills | Status: DC
  Filled 2022-04-29: qty 24, 12d supply, fill #0

## 2022-04-29 MED ORDER — SODIUM CHLORIDE 0.9% IV SOLUTION: Freq: Once | INTRAVENOUS | Status: AC

## 2022-04-29 MED ORDER — AMOXICILLIN-POT CLAVULANATE 500-125 MG PO TABS
1.0000 | ORAL_TABLET | Freq: Two times a day (BID) | ORAL | Status: DC
  Administered 2022-04-29: 1 via ORAL
  Filled 2022-04-29 (×2): qty 1

## 2022-04-30 LAB — TYPE AND SCREEN
ABO/RH(D): A NEG
Antibody Screen: NEGATIVE
Unit division: 0
Unit division: 0

## 2022-04-30 LAB — BPAM RBC
Blood Product Expiration Date: 202404062359
Blood Product Expiration Date: 202404142359
ISSUE DATE / TIME: 202403191953
ISSUE DATE / TIME: 202403210506
Unit Type and Rh: 600
Unit Type and Rh: 600

## 2022-05-02 LAB — CULTURE, BLOOD (ROUTINE X 2)
Culture: NO GROWTH
Culture: NO GROWTH
Special Requests: ADEQUATE
Special Requests: ADEQUATE

## 2022-05-10 NOTE — Discharge Instructions (Addendum)
Dear [name],   Thank you for letting us participate in your care! In this section, you will find a brief hospital admission summary of why you were admitted to the hospital, what happened during your admission, your diagnosis/diagnoses, and recommended follow up.  Primary diagnosis: Bilateral AKA site infection Treatment plan: You were treated with IV antibiotics and seen by the Vascular Surgeon. You labs improved and the Vascular doctor will continue to follow you wounds outpatient Secondary diagnosis: Hypotension Treatment plan: You blood pressure was lower when you came in requiring medication and brief ICU admission. After fluids and antibiotics it improved and was stable.  POST-HOSPITAL & CARE INSTRUCTIONS We recommend following up with your PCP within 1 week from being discharged from the hospital. REDUCE your carvedilol to 12.5 mg twice daily. See the medication list in this discharge packet. START the following antibiotics tonight at home and take until completely finished: Doxycycline 100 mg twice daily Augmentin 500 mg twice daily Please let PCP/Specialists know of any changes in medications that were made which you will be able to see in the medications section of this packet. Please also follow up with the Vascular Surgeon for your leg wounds.  DOCTOR'S APPOINTMENTS & FOLLOW UP Future Appointments  Date Time Provider [place]  05/11/2022  1:45 PM [facility] PA-2 [facility]   Thank you for choosing [facility]! Take care and be well!  [facility]  [place], [place] [address] [phone]

## 2022-05-10 NOTE — Progress Notes (Addendum)
Patient left lower arm is hard and swollen. PIV was removed from that location yesterday. Family medicine residents paged and made aware.

## 2022-05-10 NOTE — Discharge Summary (Signed)
[facility] Discharge Summary  Patient name: [name] Medical record number: [id number] Date of birth: April 20, 1965 Age: 57 y.o. Gender: male Date of Admission: 04/27/2022  Date of Discharge: 04/26/2022 Admitting Physician: Lenoria Chime, MD  Primary Care Provider: Lorelee Market, MD Consultants: VVS  Indication for Hospitalization: Sepsis  Discharge Diagnoses/Problem List:  Principal Problem for Admission: Sepsis Other Problems addressed during stay:  Principal Problem:   Sepsis ([place]) Active Problems:   Anemia   CHF (congestive heart failure) ([place])   Wound infection   Tobacco use disorder  Brief Hospital Course:  [name] is a 57 y.o.male with a history of bilateral AKA, COPD, HTN, seizures, anemia, CHF, CAD, depression and PVD who was admitted to the [facility] at [facility] for sepsis. His hospital course is detailed below:  Sepsis Upon presentation to ED patient hypotensive, tachycardic with leukocytosis to 24.5 meeting SIRS criteria with likely source as erythematous and draining bilateral AKA sites. Started on borad spectrum antibiotics with Cefepime/Vanc/Flagyl and given fluid resuscitation. Continued to be hypotensive, CCM consulted and started Norepi and gave additional fluid support. BP stabilized and patient transferred to FMTS. VVS consulted and recommended CT of AKA sites. CT did not show signs of abscess or osteomyelitis. VVS opted for conservative outpatient management. Transitions to PO Augmentin and Doxycyline x 14 day course.  Anemia Hgb 7.2 upon admission (threshold <8 given CAD), given 1u pRBCs. Post H&H >8 but Hgb 7.4 the following day. Received additional 1u pRBCs. No signs of bleeding and asymptomatic. Baseline Hgb 8-9, likely near baseline. Hgb stable at discharge.   Other chronic conditions were medically managed with home medications and formulary alternatives as necessary (seizure  disorder, GERD, chronic pain, depression)  PCP Follow-up Recommendations:  Repeat CBC to check anemia BP monitoring, add back Coreg and Imdur as appropriate Smoking cessation resources Close VVS follow up given chronic, non-healing AKA wounds  Disposition: Home  Discharge Condition: Stable  Discharge Exam:  Vitals:   04/10/2022 0804 04/12/2022 1141  BP: 125/83   Pulse: (!) 106 100  Resp: 16 16  Temp: 98.4 F (36.9 C)   SpO2: 97% 97%   General: Sitting up in hospital bed, NAD Cardiovascular: RRR without murmur Respiratory: Mild coarse breath sounds. Normal WOB on RA Abdomen: Soft, non-tender, non-distended Extremities: Bilateral AKA site with scabbing and improved erythema. Area of granulated tissue on L central AKA site  Significant Procedures: None  Significant Labs and Imaging:  Recent Labs  Lab 04/28/22 0155 05/06/2022 0203 05/03/2022 1124  WBC 13.0* 9.1  --   HGB 8.0* 7.4* 10.3*  HCT 26.2* 24.2* 31.5*  PLT 789* 592*  --    Recent Labs  Lab 04/28/22 0155 05/04/2022 0203  NA 136 133*  K 3.7 3.7  CL 109 109  CO2 16* 18*  GLUCOSE 104* 95  BUN 22* 22*  CREATININE 0.97 0.88  CALCIUM 8.3* 7.9*  PHOS 3.3  --   ALBUMIN 2.0*  --     Pertinent Imaging: CT EXTREM LOWER WO CM BIL Result Date: 04/27/2022 IMPRESSION: Prior bilateral above knee amputations. Diffuse edema throughout the subcutaneous soft tissues without focal fluid collection/abscess or evidence of osteomyelitis.  DG Chest Port 1 View Result Date: 04/27/2022 IMPRESSION: Nonspecific interstitial prominence consistent with atypical infection, edema or interstitial lung disease. No focal consolidation.  Results/Tests Pending at Time of Discharge: None  Discharge Medications:  Allergies as of 05/07/2022   No Known Allergies  Medication List     STOP taking these medications    isosorbide dinitrate 10 MG tablet Commonly known as: ISORDIL   meclizine 25 MG tablet Commonly known as: ANTIVERT    tadalafil 20 MG tablet Commonly known as: CIALIS       TAKE these medications    amoxicillin-clavulanate 500-125 MG tablet Commonly known as: AUGMENTIN Take 1 tablet by mouth 2 (two) times daily for 12 days.   aspirin EC 81 MG tablet Take 325 mg by mouth daily.   atorvastatin 40 MG tablet Commonly known as: LIPITOR Take 40 mg by mouth at bedtime.   carvedilol 12.5 MG tablet Commonly known as: COREG Take 1 tablet (12.5 mg total) by mouth 2 (two) times daily with a meal. What changed:  medication strength how much to take   clopidogrel 75 MG tablet Commonly known as: PLAVIX Take 75 mg by mouth daily.   doxycycline 100 MG tablet Commonly known as: VIBRA-TABS Take 1 tablet (100 mg total) by mouth every 12 (twelve) hours for 12 days.   DULoxetine 30 MG capsule Commonly known as: CYMBALTA Take 30 mg by mouth 2 (two) times daily.   gabapentin 400 MG capsule Commonly known as: NEURONTIN Take 400 mg by mouth 3 (three) times daily.   HYDROcodone-acetaminophen 10-325 MG tablet Commonly known as: NORCO Take 1 tablet by mouth every 6 (six) hours. What changed: Another medication with the same name was removed. Continue taking this medication, and follow the directions you see here.   ipratropium-albuterol 0.5-2.5 (3) MG/3ML Soln Commonly known as: DUONEB Inhale 3 mLs into the lungs every 4 (four) hours as needed (COPD).   levETIRAcetam 500 MG tablet Commonly known as: KEPPRA Take 500 mg by mouth 2 (two) times daily.   levocetirizine 5 MG tablet Commonly known as: XYZAL Take 5 mg by mouth every evening.   Movantik 25 MG Tabs tablet Generic drug: naloxegol oxalate Take 25 mg by mouth daily as needed (Constipation).   pantoprazole 40 MG tablet Commonly known as: PROTONIX Take 40 mg by mouth daily.   pyridOXINE 100 MG tablet Commonly known as: VITAMIN B6 Take 100 mg by mouth daily.   tamsulosin 0.4 MG Caps capsule Commonly known as: FLOMAX Take 0.4 mg by  mouth daily.   thiamine 100 MG tablet Commonly known as: VITAMIN B1 Take 100 mg by mouth daily.               Discharge Care Instructions  (From admission, onward)           Start     Ordered   04/22/2022 0000  Discharge wound care:       Comments: Cleanse incisions with soap and water at least daily, then pat dry   04/13/2022 1246            Discharge Instructions: Please refer to Patient Instructions section of EMR for full details.  Patient was counseled important signs and symptoms that should prompt return to medical care, changes in medications, dietary instructions, activity restrictions, and follow up appointments.   Follow-Up Appointments: With PCP within 1 week   Colletta Maryland, MD 04/24/2022, 12:46 PM PGY-1, [place]

## 2022-05-10 NOTE — Progress Notes (Addendum)
  Progress Note  VASCULAR SURGERY ASSESSMENT & PLAN:   S/P BILATERAL AKA'S: His amputation sites continue to look better.  There is no significant drainage or erythema.  No plans for revision at this point.  He can be discharged from our standpoint and I can follow these wounds as an outpatient.  [name], MD 8:14 AM   03/Paul/2024 7:42 AM * No surgery found *  Subjective:  feels better today.  Has a good appetite.   Vitals:   04/25/2022 0531 05/05/2022 0546  BP: 120/85 119/79  Pulse: 94 91  Resp: 17 17  Temp: 98.4 F (36.9 C) 98.4 F (36.9 C)  SpO2:     Physical Exam: Lungs:  non labored Incisions:  incisions with less erythema compared to pictures on chart, no drainage Neurologic: A&O  CBC    Component Value Date/Time   WBC 9.1 05/08/2022 0203   RBC 2.76 (L) 05/04/2022 0203   HGB 7.4 (L) 04/18/2022 0203   HCT 24.2 (L) 05/06/2022 0203   PLT 592 (H) 04/17/2022 0203   MCV 87.7 04/25/2022 0203   MCH 26.8 05/06/2022 0203   MCHC 30.6 04/15/2022 0203   RDW 17.4 (H) 04/23/2022 0203   LYMPHSABS 1.2 04/18/2022 0203   MONOABS 1.3 (H) 05/01/2022 0203   EOSABS 0.1 05/09/2022 0203   BASOSABS 0.1 04/25/2022 0203    BMET    Component Value Date/Time   NA 133 (L) 04/23/2022 0203   K 3.7 04/09/2022 0203   CL 109 04/25/2022 0203   CO2 18 (L) 05/09/2022 0203   GLUCOSE 95 04/24/2022 0203   BUN 22 (H) 04/09/2022 0203   CREATININE 0.88 03/Paul/2024 0203   CALCIUM 7.9 (L) 05/02/2022 0203   GFRNONAA >60 05/01/2022 0203   GFRAA 74 (L) 12/29/2012 1023    INR    Component Value Date/Time   INR 1.4 (H) 04/27/2022 1158     Intake/Output Summary (Last 24 hours) at 04/09/2022 [id number] Last data filed at 04/28/2022 1700 Gross per 24 hour  Intake 203.27 ml  Output --  Net 203.27 ml     Assessment/Plan:  57 y.o. Brennan with slow to heal B AKA  AKAs with less peri-incisional erythema Continue current abx regimen Orders written to cleanse incisions with soap and water at least  daily, then pat dry No indication for further surgical revision at this time   Dagoberto Ligas, PA-C [facility] [phone] 04/28/2022 7:42 AM

## 2022-05-10 NOTE — Progress Notes (Signed)
PT Cancellation Note  Patient Details Name: [name] MRN: [id number] DOB: December 17, 1965   Cancelled Treatment:    Reason Eval/Treat Not Completed: PT screened, no needs identified, will sign off. Pt is at baseline for mobility, mod I at w/c level. Pt/wife report no needs.   Lorriane Shire 04/24/2022, 11:36 AM

## 2022-05-10 NOTE — Evaluation (Signed)
Occupational Therapy Evaluation Patient Details Name: [name] MRN: [id number] DOB: 13-Jan-1966 Today's Date: 04/21/2022   History of Present Illness [name] is a 57 y.o. male presenting with sepsis most likely secondary to bilateral AKA site infections. PMHx includes bilateral AKA, COPD, HTN, seizures, anemia, CHF, CAD, depression and PVD   Clinical Impression   Pt was admitted as above presenting with deficits as listed below (refer to OT problem list). Pt is currently Mod I for bed mobility, basic ADL's with set up and functional transfers EOB to w/c noted. Bilateral UE strength is 5/5. Pt & spouse report that pt has necessary DME at home and do not foresee any further OT needs at this time. Pt spouse/family can provide necessary PRN assist at d/c per his report for ADL's and functional moblity related to ADL's. Will sign off OT at this time as pt appears to be at or near baseline level. Pt/wife will reach out to MD should anything change.      Recommendations for follow up therapy are one component of a multi-disciplinary discharge planning process, led by the attending physician.  Recommendations may be updated based on patient status, additional functional criteria and insurance authorization.   Follow Up Recommendations  No OT follow up     Assistance Recommended at Discharge Intermittent Supervision/Assistance  Patient can return home with the following A little help with bathing/dressing/bathroom;Assistance with cooking/housework;Help with stairs or ramp for entrance;Assist for transportation    Functional Status Assessment  Patient has not had a recent decline in their functional status  Equipment Recommendations  None recommended by OT (Pt has necessary DME)    Recommendations for Other Services  None at this time     Precautions / Restrictions   Fall     Mobility Bed Mobility Overal bed mobility: Modified Independent     Patient Response:  Cooperative  Transfers Overall transfer level: Independent   General transfer comment: No hands on assist for posterior transfer from bed to w/c. Pt appears to be at baseline level for functional mobility. He reports that he is getting custom w/c at end of March 2024      Balance Overall balance assessment: Modified Independent, No apparent balance deficits (not formally assessed)     ADL either performed or assessed with clinical judgement   ADL Overall ADL's : At baseline   General ADL Comments: Pt is currently Mod I for bed mobility, ADL's and functional transfers EOB to w/c and w/c to chair noted. Bilateral UE strength is 5/5. Pt & spouse report that pt has necessary DME at home and do not foresee any further OT needs at this time. Pt spouse/family can provide necessary PRN assist at d/c per his report for ADL's and functional moblity related to ADL's. Will sign off OT at this time as pt appears to be at or near baseline level. Pt/wife will reach out to MD should anything change.     Vision Patient Visual Report: No change from baseline Vision Assessment?: No apparent visual deficits            Pertinent Vitals/Pain Pain Assessment Pain Assessment: 0-10 Pain Score: 5  Faces Pain Scale: Hurts even more Pain Location: Pt reports pain "from bed sores is worse than the leg pain today" Buttocks and bilateral AKA/LE's Pain Descriptors / Indicators: Grimacing, Discomfort, Sore, Guarding Pain Intervention(s): Limited activity within patient's tolerance, Monitored during session, Repositioned, RN gave pain meds during session     Hand Dominance  Right   Extremity/Trunk Assessment Upper Extremity Assessment Upper Extremity Assessment: Overall WFL for tasks assessed   Lower Extremity Assessment Lower Extremity Assessment: Defer to PT evaluation       Communication Communication Communication: No difficulties   Cognition Arousal/Alertness: Awake/alert Behavior During Therapy:  WFL for tasks assessed/performed Overall Cognitive Status: Within Functional Limits for tasks assessed         General Comments  Pt reports that pain is 5/10 in buttocks and bilateral LE's/AKA's but is better controlled with pain medications, RN gave meds during session. Pt is hopeful to d/c home later today. He appears to be at or near baseline level for ADL's and functional transfers.            Home Living Family/patient expects to be discharged to:: Private residence Living Arrangements: Spouse/significant other;Parent Available Help at Discharge: Family;Available 24 hours/day Type of Home: House Home Access: Stairs to enter;Ramped entrance Entrance Stairs-Number of Steps: ramp, then step onto porch, and then threshold into house Entrance Stairs-Rails: None Home Layout: One level     Bathroom Shower/Tub: Teacher, early years/pre: Standard Bathroom Accessibility: Yes How Accessible: Accessible via wheelchair Home Equipment: Shower seat;Wheelchair - manual   Additional Comments: Pt waiting on new custom manual w/c - "I should get it by the end of March"      Prior Functioning/Environment  Mod I with DME        OT Problem List: Pain;Decreased activity tolerance      OT Treatment/Interventions:   Eval only   OT Goals(Current goals can be found in the care plan section) Acute Rehab OT Goals Patient Stated Goal: Go home later today if able OT Goal Formulation: All assessment and education complete, DC therapy Time For Goal Achievement: 04/27/2022 Potential to Achieve Goals: Good  OT Frequency:         AM-PAC OT "6 Clicks" Daily Activity     Outcome Measure Help from another person eating meals?: None Help from another person taking care of personal grooming?: A Little Help from another person toileting, which includes using toliet, bedpan, or urinal?: A Little Help from another person bathing (including washing, rinsing, drying)?: A Little Help from  another person to put on and taking off regular upper body clothing?: None Help from another person to put on and taking off regular lower body clothing?: None 6 Click Score: 21   End of Session Equipment Utilized During Treatment: Other (comment) (manual w/c) Nurse Communication: Mobility status;Other (comment) (Pt is at or near baseline level, no needs identified.)  Activity Tolerance: Patient tolerated treatment well Patient left: Other (comment);with call bell/phone within reach;with family/visitor present ([place] w/c)  OT Visit Diagnosis: Pain Pain - Right/Left:  (buttocks and bilateral LE's) Pain - part of body:  (buttocks and bilateral LE's)                Time: [id number] OT Time Calculation (min): 22 min Charges:  OT General Charges $OT Visit: 1 Visit OT Evaluation $OT Eval Low Complexity: 1 Low  Asim Gersten Beth Dixon, OTR/L 05/09/2022, 11:50 AM

## 2022-05-10 NOTE — Progress Notes (Addendum)
     Daily Progress Note Intern Pager: [phone]  Patient name: [name] Medical record number: [id number] Date of birth: 07/10/1965 Age: 57 y.o. Gender: male  Primary Care Provider: Lorelee Market, MD Consultants: VVS Code Status: Full code   Pt Overview and Major Events to Date:  3/19: Admitted to FMTS   Assessment and Plan: [name] is a 57 y.o. male presenting with sepsis most likely secondary to bilateral AKA site infections. PMHx includes bilateral AKA, COPD, HTN, seizures, anemia, CHF, CAD, depression and PVD.  * Sepsis ([facility]) BP stable and leukocytosis resolved. Per VVS, clear for discharge and outpatient management of wounds. Will plan to transition to oral antibiotics. BC no growth thus far. -VVS signed off -Transition from Cefepime and Vancomycin to Doxycycline and Augmentin for 14 day course -Pain control: Home Norco 10-325 q6h, Dilaudid 0.5mg  q6h prn -ASA, Plavix and Crestor -F/u blood culture  Anemia Hgb 7.4 this AM, receiving 1u pRBC. Baseline close to transfusion threshold, likely stable for patient. No reported bleeding or blood in stool. -Consider oral iron on discharge -Transfusion threshold <8  Tobacco use disorder Currently smoke around 1/2 pack per day x 20-25 year. -Nicotine patch 21mg   CHF (congestive heart failure) (HCC) Appears euvolemic upon exam and BP stable. Most recent Echo in 2021 with EF 55-60%. -Restart Coreg 12.5mg  BID given BP improvement, continue titration as needed outpatient -Holding home Isordil 10mg  TID in the setting of soft BPs   Chronic stable conditions: Seizures: Keppra 500mg  BID  GERD: Protonix 45mf daily Depression: Duloxetine 30mg  BID HLD: Lipitor 40mg  daily Neuropathy: Gabapentin 400mg  TID BPH: Flomax 0.4mg  daily   FEN/GI: Heart healthy diet PPx: Lovenox Dispo: Home pending medical stabilization and VVS evaluation  Subjective:  Patient assessed at bedside reports he feels well and would like  to get out of here today. States he has trouble sleeping in the hospital bed.  Objective: Temp:  [97.6 F (36.4 C)-98.5 F (36.9 C)] 98.4 F (36.9 C) (03/21 0804) Pulse Rate:  [91-106] 100 (03/21 1141) Resp:  [12-22] 16 (03/21 1141) BP: (105-133)/(68-87) 125/83 (03/21 0804) SpO2:  [95 %-98 %] 97 % (03/21 1141) Physical Exam: General: Sitting up in hospital bed, NAD Cardiovascular: RRR without murmur Respiratory: Mild coarse breath sounds. Normal WOB on RA Abdomen: Soft, non-tender, non-distended Extremities: Bilateral AKA site with scabbing and improved erythema. Area of granulated tissue on L central AKA site  Laboratory: Most recent CBC Lab Results  Component Value Date   WBC 9.1 05/04/2022   HGB 7.4 (L) 05/05/2022   HCT 24.2 (L) 04/28/2022   MCV 87.7 05/08/2022   PLT 592 (H) 04/22/2022   Most recent BMP    Latest Ref Rng & Units 04/26/2022    2:03 AM  BMP  Glucose 70 - 99 mg/dL 95   BUN 6 - 20 mg/dL 22   Creatinine 0.61 - 1.24 mg/dL 0.88   Sodium 135 - 145 mmol/L 133   Potassium 3.5 - 5.1 mmol/L 3.7   Chloride 98 - 111 mmol/L 109   CO2 22 - 32 mmol/L 18   Calcium 8.9 - 10.3 mg/dL 7.9      Colletta Maryland, MD 05/06/2022, 11:50 AM  PGY-1, [place] Intern pager: [phone], text pages welcome Secure chat group [place]

## 2022-05-10 DEATH — deceased

## 2022-05-11 ENCOUNTER — Ambulatory Visit: Payer: Medicare Other
